# Patient Record
Sex: Male | Born: 1985 | Race: White | Hispanic: No | Marital: Single | Smoking: Never smoker
Health system: Southern US, Community
[De-identification: ages and names within clinical notes are randomized; demographics above are authoritative.]

## PROBLEM LIST (undated history)

## (undated) DIAGNOSIS — J45909 Unspecified asthma, uncomplicated: Secondary | ICD-10-CM

## (undated) HISTORY — DX: Unspecified asthma, uncomplicated: J45.909

---
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
## 2008-03-11 HISTORY — PX: LIPOSUCTION: SHX10

## 2013-03-29 ENCOUNTER — Encounter (INDEPENDENT_AMBULATORY_CARE_PROVIDER_SITE_OTHER): Payer: Self-pay

## 2013-03-29 ENCOUNTER — Encounter: Payer: Self-pay | Admitting: Diagnostic Neuroimaging

## 2013-03-29 ENCOUNTER — Ambulatory Visit (INDEPENDENT_AMBULATORY_CARE_PROVIDER_SITE_OTHER): Payer: BC Managed Care – PPO | Admitting: Diagnostic Neuroimaging

## 2013-03-29 VITALS — BP 125/87 | HR 66 | Temp 98.4°F | Ht 71.0 in | Wt 320.0 lb

## 2013-03-29 DIAGNOSIS — G510 Bell's palsy: Secondary | ICD-10-CM

## 2013-03-29 MED ORDER — AMITRIPTYLINE HCL 25 MG PO TABS: 25.0000 mg | ORAL_TABLET | Freq: Every day | ORAL | Status: DC

## 2013-03-29 NOTE — Progress Notes (Signed)
[facility]  PATIENT: Dustin Mccall. DOB: 1985-07-08  REFERRING CLINICIAN: Sandhu HISTORY FROM: patient  REASON FOR VISIT: new consult   HISTORICAL  CHIEF COMPLAINT:  Chief Complaint  Patient presents with  . Neurologic Problem    Bell's Palsy    HISTORY OF PRESENT ILLNESS:   28 year old male with asthma, here with left Bell's palsy.  03/17/13, patient noticed altered taste sensation. A glass of water tasted "metallic".  03/18/13, patient woke up and noticed the left corner of his mouth was slightly improving. His left eye was wide open and unable to shut. He also had some pain behind his left ear. He noticed some "bumps" which looked like complex left posterior auricular region and left posterior auricle. No change in hearing sensation or ringing in the ears.  03/19/13 patient went to urgent care and was diagnosed with Bell's palsy. He was treated with valacyclovir and prednisone course, which she just recently completed.  Since that time his taste sensation has slightly improved. He feels like he is somewhat improved control of his left eye. His left mouth is still almost completely paralyzed. He has noted some diffuse pressure and pain/headache sensations left occipital left parietal region, radiating to his left year and left face.  No prodromal infection, fever, chills, or vaccinations, international travel or exposure. He has no symptoms on his right face, arms or legs no vision changes.  Patient has been using artificial tears and left eye patch at nighttime for corneal protection.  REVIEW OF SYSTEMS: Full 14 system review of systems performed and notable only for headache blurred vision shortness of breath snoring.  ALLERGIES: No Known Allergies  HOME MEDICATIONS: No outpatient prescriptions prior to visit.   No facility-administered medications prior to visit.    PAST MEDICAL HISTORY: No past medical history on file.  PAST SURGICAL  HISTORY: No past surgical history on file.  FAMILY HISTORY: No family history on file.  SOCIAL HISTORY:  History   Social History  . Marital Status: Single    Spouse Name: N/A    Number of Children: 0  . Years of Education: MA   Occupational History  .  Other    unemployed   Social History Main Topics  . Smoking status: Never Smoker   . Smokeless tobacco: Current User    Types: Chew     Comment: 1 can every three days  . Alcohol Use: Yes     Comment: 3-4 beers socially  . Drug Use: No  . Sexual Activity: Not on file   Other Topics Concern  . Not on file   Social History Narrative   Patient lives at home with family.   Caffeine Use: 1 energy drink weekly     PHYSICAL EXAM  Filed Vitals:   03/29/13 0910  BP: 125/87  Pulse: 66  Temp: 98.4 F (36.9 C)  TempSrc: Oral  Height: 5\' 11"  (1.803 m)  Weight: 320 lb (145.151 kg)    Not recorded    Body mass index is 44.65 kg/(m^2).  GENERAL EXAM: Patient is in no distress; well developed, nourished and groomed; neck is supple  CARDIOVASCULAR: Regular rate and rhythm, no murmurs, no carotid bruits  NEUROLOGIC: MENTAL STATUS: awake, alert, oriented to person, place and time, recent and remote memory intact, normal attention and concentration, language fluent, comprehension intact, naming intact, fund of knowledge appropriate CRANIAL NERVE: no papilledema on fundoscopic exam, pupils equal and reactive to light, visual fields full to confrontation, extraocular  muscles intact, no nystagmus, SLIGHTLY DECR LEFT FOREHEAD SENS TO LT; DECR LEFT UPPER AND LOWER FACIAL STRENGHT. INCOMPLETE LEFT EYE CLOSURE. Hearing intact, palate elevates symmetrically, uvula midline, shoulder shrug symmetric, tongue midline. SMALL RED, LESION IN THE LEFT POSTERIOR AURICULAR REGION. NO VESICLES IN THE EXTERNAL AUDITORY CANALS OR TYMPANIC MEMBRANES. MOTOR: normal bulk and tone, full strength in the BUE, BLE SENSORY: normal and symmetric to  light touch, pinprick, temperature, vibration COORDINATION: finger-nose-finger, fine finger movements, heel-shin normal REFLEXES: deep tendon reflexes present and symmetric GAIT/STATION: narrow based gait; able to walk on toes, heels and tandem; romberg is negative    DIAGNOSTIC DATA (LABS, IMAGING, TESTING) - I reviewed patient records, labs, notes, testing and imaging myself where available.  No results found for this basename: WBC, HGB, HCT, MCV, PLT   No results found for this basename: na, k, cl, co2, glucose, bun, creatinine, calcium, prot, albumin, ast, alt, alkphos, bilitot, gfrnonaa, gfraa   No results found for this basename: CHOL, HDL, LDLCALC, LDLDIRECT, TRIG, CHOLHDL   No results found for this basename: HGBA1C   No results found for this basename: VITAMINB12   No results found for this basename: TSH      ASSESSMENT AND PLAN  28 y.o. year old male here with left peripheral cranial nerve 7 palsy starting on 03/17/13. Could represent Bell's palsy. 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 In any case, the patient received appropriate treatment with valtrex + prednisone.  PLAN: - amitriptyline for left side head pain and ear pain - continue eye precautions  Return in about 3 months (around 06/27/2013).    Suanne MarkerVIKRAM R. Blain Hunsucker, MD 03/29/2013, 10:33 AM Certified in Neurology, Neurophysiology and Neuroimaging  [facility] [address], [address] [place], [place] [address] [phone]

## 2013-06-28 ENCOUNTER — Encounter (INDEPENDENT_AMBULATORY_CARE_PROVIDER_SITE_OTHER): Payer: Self-pay

## 2013-06-28 ENCOUNTER — Encounter: Payer: Self-pay | Admitting: Diagnostic Neuroimaging

## 2013-06-28 ENCOUNTER — Ambulatory Visit (INDEPENDENT_AMBULATORY_CARE_PROVIDER_SITE_OTHER): Payer: BC Managed Care – PPO | Admitting: Diagnostic Neuroimaging

## 2013-06-28 VITALS — BP 117/78 | HR 93 | Ht 71.0 in | Wt 323.0 lb

## 2013-06-28 DIAGNOSIS — G510 Bell's palsy: Secondary | ICD-10-CM

## 2013-06-28 NOTE — Patient Instructions (Signed)
Continue eye safety precautions.

## 2013-06-28 NOTE — Progress Notes (Signed)
[facility]  PATIENT: Dustin SarMichael T Chausse Jr. DOB: [birth year]  REFERRING CLINICIAN: Sandhu HISTORY FROM: patient  REASON FOR VISIT: new consult   HISTORICAL  CHIEF COMPLAINT:  Chief Complaint  Patient presents with  . Follow-up    HISTORY OF PRESENT ILLNESS:   UPDATE 06/28/13: Since last visit, facial weakness symptoms are stable. Left ear pain is improved. Off amitriptyline now. Taste is improved. No eye pain.  PRIOR HPI (03/29/13): 28 year old male with asthma, here with left Bell's palsy.   03/17/13, patient noticed altered taste sensation. A glass of water tasted "metallic".  03/18/13, patient woke up and noticed the left corner of his mouth was slightly improving. His left eye was wide open and unable to shut. He also had some pain behind his left ear. He noticed some "bumps" which looked like complex left posterior auricular region and left posterior auricle. No change in hearing sensation or ringing in the ears.  03/19/13 patient went to urgent care and was diagnosed with Bell's palsy. He was treated with valacyclovir and prednisone course, which she just recently completed.  Since that time his taste sensation has slightly improved. He feels like he is somewhat improved control of his left eye. His left mouth is still almost completely paralyzed. He has noted some diffuse pressure and pain/headache sensations left occipital left parietal region, radiating to his left year and left face. No prodromal infection, fever, chills, or vaccinations, international travel or exposure. He has no symptoms on his right face, arms or legs no vision changes. Patient has been using artificial tears and left eye patch at nighttime for corneal protection.  REVIEW OF SYSTEMS: Full 14 system review of systems performed and notable only for speech diff and facial drooping.  ALLERGIES: No Known Allergies  HOME MEDICATIONS: Outpatient Prescriptions Prior to Visit  Medication Sig  Dispense Refill  . mometasone-formoterol (DULERA) 100-5 MCG/ACT AERO Inhale 2 puffs into the lungs 2 (two) times daily.      Marland Kitchen. amitriptyline (ELAVIL) 25 MG tablet Take 1 tablet (25 mg total) by mouth at bedtime.  30 tablet  3   No facility-administered medications prior to visit.    PAST MEDICAL HISTORY: Past Medical History  Diagnosis Date  . Asthma     PAST SURGICAL HISTORY: History reviewed. No pertinent past surgical history.  FAMILY HISTORY: History reviewed. No pertinent family history.  SOCIAL HISTORY:  History   Social History  . Marital Status: Single    Spouse Name: N/A    Number of Children: 0  . Years of Education: MA   Occupational History  .  Other    unemployed   Social History Main Topics  . Smoking status: Never Smoker   . Smokeless tobacco: Current User    Types: Chew     Comment: 1 can every three days  . Alcohol Use: Yes     Comment: 3-4 beers socially  . Drug Use: No  . Sexual Activity: Not on file   Other Topics Concern  . Not on file   Social History Narrative   Patient lives at home with family.   Caffeine Use: 1 energy drink weekly     PHYSICAL EXAM  Filed Vitals:   06/28/13 1406  BP: 117/78  Pulse: 93  Height: 5\' 11"  (1.803 m)  Weight: 323 lb (146.512 kg)    Not recorded    Body mass index is 45.07 kg/(m^2).  GENERAL EXAM: Patient is in no distress; well developed, nourished and  groomed; neck is supple  CARDIOVASCULAR: Regular rate and rhythm, no murmurs, no carotid bruits  NEUROLOGIC: MENTAL STATUS: awake, alert, oriented to person, place and time, recent and remote memory intact, normal attention and concentration, language fluent, comprehension intact, naming intact, fund of knowledge appropriate CRANIAL NERVE: no papilledema on fundoscopic exam, pupils equal and reactive to light, visual fields full to confrontation, extraocular muscles intact, no nystagmus, SLIGHTLY DECR LEFT FOREHEAD SENS TO LT; DECR LEFT UPPER  AND LOWER FACIAL STRENGHT. INCOMPLETE LEFT EYE CLOSURE. Hearing intact, palate elevates symmetrically, uvula midline, shoulder shrug symmetric, tongue midline.  MOTOR: normal bulk and tone, full strength in the BUE, BLE SENSORY: normal and symmetric to light touch, pinprick, temperature, vibration COORDINATION: finger-nose-finger, fine finger movements, heel-shin normal REFLEXES: deep tendon reflexes present and symmetric GAIT/STATION: narrow based gait; able to walk tandem; romberg is negative    DIAGNOSTIC DATA (LABS, IMAGING, TESTING) - I reviewed patient records, labs, notes, testing and imaging myself where available.  No results found for this basename: WBC,  HGB,  HCT,  MCV,  PLT   No results found for this basename: na,  k,  cl,  co2,  glucose,  bun,  creatinine,  calcium,  prot,  albumin,  ast,  alt,  alkphos,  bilitot,  gfrnonaa,  gfraa   No results found for this basename: CHOL,  HDL,  LDLCALC,  LDLDIRECT,  TRIG,  CHOLHDL   No results found for this basename: HGBA1C   No results found for this basename: VITAMINB12   No results found for this basename: TSH     ASSESSMENT AND PLAN  [age] y.o. year old male here with left peripheral cranial nerve 7 palsy starting on 03/17/13. Could represent Bell's palsy. 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 In any case, the patient received appropriate treatment with valtrex + prednisone.  PLAN: - continue eye precautions - f/u as needed  Return if symptoms worsen or fail to improve.    Suanne MarkerVIKRAM R. Babette Stum, MD 06/28/2013, 2:46 PM Certified in Neurology, Neurophysiology and Neuroimaging  [facility] [address], [address] [place], [place] [address] [phone]

## 2013-11-04 ENCOUNTER — Ambulatory Visit (INDEPENDENT_AMBULATORY_CARE_PROVIDER_SITE_OTHER): Payer: BC Managed Care – PPO | Admitting: Surgery

## 2013-11-05 ENCOUNTER — Ambulatory Visit (INDEPENDENT_AMBULATORY_CARE_PROVIDER_SITE_OTHER): Payer: BC Managed Care – PPO | Admitting: General Surgery

## 2013-11-11 ENCOUNTER — Ambulatory Visit (INDEPENDENT_AMBULATORY_CARE_PROVIDER_SITE_OTHER): Payer: BC Managed Care – PPO | Admitting: General Surgery

## 2013-11-11 ENCOUNTER — Other Ambulatory Visit (INDEPENDENT_AMBULATORY_CARE_PROVIDER_SITE_OTHER): Payer: Self-pay | Admitting: General Surgery

## 2013-11-11 NOTE — Addendum Note (Signed)
Addended byIgnacia Marvel on: 11/11/2013 03:24 PM   Modules accepted: Orders

## 2013-11-20 ENCOUNTER — Encounter: Payer: BC Managed Care – PPO | Attending: General Surgery | Admitting: Dietician

## 2013-11-20 ENCOUNTER — Encounter: Payer: Self-pay | Admitting: Dietician

## 2013-11-20 DIAGNOSIS — Z01818 Encounter for other preprocedural examination: Secondary | ICD-10-CM | POA: Diagnosis not present

## 2013-11-20 DIAGNOSIS — Z713 Dietary counseling and surveillance: Secondary | ICD-10-CM | POA: Diagnosis not present

## 2013-11-20 DIAGNOSIS — Z6841 Body Mass Index (BMI) 40.0 and over, adult: Secondary | ICD-10-CM | POA: Diagnosis not present

## 2013-11-20 NOTE — Progress Notes (Signed)
  Pre-Op Assessment Visit:  Pre-Operative LAGB Surgery  Medical Nutrition Therapy:  Appt start time: 1100   End time:  1130.  Patient was seen on 11/20/2013 for Pre-Operative LAGB Nutrition Assessment. Assessment and letter of approval faxed to [facility] Surgery Bariatric Surgery Program coordinator on 11/20/2013.   Preferred Learning Style:   No preference indicated   Learning Readiness:   Ready  Handouts given during visit include:  Pre-Op Goals Bariatric Surgery Protein Shakes Pre op diet handout per patient request  Teaching Method Utilized:  Visual Auditory Hands on  Barriers to learning/adherence to lifestyle change: none  Demonstrated degree of understanding via:  Teach Back   Patient to call the [facility] to enroll in Pre-Op and Post-Op Nutrition Education when surgery date is scheduled.

## 2013-11-25 LAB — HEMOGLOBIN A1C
Hgb A1c MFr Bld: 5.7 % — ABNORMAL HIGH (ref <5.7)
MEAN PLASMA GLUCOSE: 117 mg/dL — AB (ref <117)

## 2013-11-25 LAB — TSH: TSH: 3.739 u[IU]/mL (ref 0.350–4.500)

## 2013-11-25 LAB — H. PYLORI ANTIBODY, IGG: H Pylori IgG: 0.65 {ISR}

## 2013-12-01 ENCOUNTER — Ambulatory Visit (HOSPITAL_COMMUNITY)
Admission: RE | Admit: 2013-12-01 | Discharge: 2013-12-01 | Disposition: A | Discharge status: Home / Self Care | Payer: BC Managed Care – PPO | Source: Ambulatory Visit | Attending: General Surgery | Admitting: General Surgery

## 2013-12-01 DIAGNOSIS — K7689 Other specified diseases of liver: Secondary | ICD-10-CM | POA: Diagnosis not present

## 2013-12-01 DIAGNOSIS — Z01818 Encounter for other preprocedural examination: Secondary | ICD-10-CM | POA: Diagnosis not present

## 2013-12-01 DIAGNOSIS — Z6841 Body Mass Index (BMI) 40.0 and over, adult: Secondary | ICD-10-CM | POA: Insufficient documentation

## 2013-12-31 ENCOUNTER — Other Ambulatory Visit (INDEPENDENT_AMBULATORY_CARE_PROVIDER_SITE_OTHER): Payer: Self-pay | Admitting: General Surgery

## 2014-01-17 ENCOUNTER — Encounter: Payer: BC Managed Care – PPO | Attending: General Surgery

## 2014-01-17 VITALS — Ht 71.0 in | Wt 316.0 lb

## 2014-01-17 DIAGNOSIS — Z6841 Body Mass Index (BMI) 40.0 and over, adult: Secondary | ICD-10-CM | POA: Insufficient documentation

## 2014-01-17 DIAGNOSIS — E669 Obesity, unspecified: Secondary | ICD-10-CM | POA: Diagnosis present

## 2014-01-17 DIAGNOSIS — Z713 Dietary counseling and surveillance: Secondary | ICD-10-CM | POA: Diagnosis not present

## 2014-01-19 NOTE — Progress Notes (Signed)
  Pre-Operative Nutrition Class:  Appt start time: 2072   End time:  1830.  Patient was seen on 01/17/2014 for Pre-Operative Bariatric Surgery Education at the [facility].   Surgery date: 02/07/14 Surgery type: LAGB Start weight at [facility]: 319 lbs on 11/20/13 Weight today: 316 lbs  TANITA  BODY COMP RESULTS  01/17/14   BMI (kg/m^2) 44.1   Fat Mass (lbs) 181   Fat Free Mass (lbs) 135   Total Body Water (lbs) 99   Samples given per MNT protocol. Patient educated on appropriate usage: Premier protein shake (vanilla - qty 1) Lot #: [id number] Exp: 07/2014  Unjury protein powder (chicken soup - qty 1) Lot #: [id number] Exp: 02/2014  Celebrate Vitamins Multivitamin (pineapple strawberry - qty 1) Lot #: [id number] Exp: 04/2014  PB2 (qty 1) Lot #: [id number] Exp: 10/2014  The following the learning objectives were met by the patient during this course:  Identify Pre-Op Dietary Goals and will begin 2 weeks pre-operatively  Identify appropriate sources of fluids and proteins   State protein recommendations and appropriate sources pre and post-operatively  Identify Post-Operative Dietary Goals and will follow for 2 weeks post-operatively  Identify appropriate multivitamin and calcium sources  Describe the need for physical activity post-operatively and will follow MD recommendations  State when to call healthcare provider regarding medication questions or post-operative complications  Handouts given during class include:  Pre-Op Bariatric Surgery Diet Handout  Protein Shake Handout  Post-Op Bariatric Surgery Nutrition Handout  BELT Program Information Flyer  Support Group Information Flyer  [facility] Bariatric Supplements Price List  Follow-Up Plan: Patient will follow-up at [facility] 2 weeks post operatively for diet advancement per MD.

## 2014-01-27 NOTE — Patient Instructions (Addendum)
Dustin SarMichael T Everitt Jr.  01/27/2014                           YOUR PROCEDURE IS SCHEDULED ON:  02/07/14                ENTER FROM [address] - GO TO PARKING DECK               LOOK FOR VALET PARKING  / [employer]                              FOLLOW  SIGNS TO SHORT STAY CENTER                 ARRIVE AT SHORT STAY AT:  8:15 AM               CALL THIS NUMBER IF ANY PROBLEMS THE DAY OF SURGERY :               [phone]                                REMEMBER:   Do not eat food or drink liquids AFTER MIDNIGHT              STOP ASPIRIN AND HERBAL MEDS 7 DAYS PREOP                  Take these medicines the morning of surgery with               A SIPS OF WATER :   NONE      Do not wear jewelry, make-up   Do not wear lotions, powders, or perfumes.   Do not shave legs or underarms 12 hrs. before surgery (men may shave face)  Do not bring valuables to the hospital.  Contacts, dentures or bridgework may not be worn into surgery.  Leave suitcase in the car. After surgery it may be brought to your room.  For patients admitted to the hospital more than one night, checkout time is            11:00 AM                                                      ________________________________________________________________________                                                                                                  [place] - PREPARING FOR SURGERY  Before surgery, you can play an important role.  Because skin is not sterile, your skin needs to be as free of germs as possible.  You can reduce the number of germs on your skin by washing with CHG (chlorahexidine gluconate) soap before surgery.  CHG is an antiseptic  cleaner which kills germs and bonds with the skin to continue killing germs even after washing. Please DO NOT use if you have an allergy to CHG or antibacterial soaps.  If your skin becomes reddened/irritated stop using the CHG and inform your nurse when you  arrive at Short Stay. Do not shave (including legs and underarms) for at least 48 hours prior to the first CHG shower.  You may shave your face. Please follow these instructions carefully:   1.  Shower with CHG Soap the night before surgery and the  morning of Surgery.   2.  If you choose to wash your hair, wash your hair first as usual with your  normal  Shampoo.   3.  After you shampoo, rinse your hair and body thoroughly to remove the  shampoo.                                         4.  Use CHG as you would any other liquid soap.  You can apply chg directly  to the skin and wash . Gently wash with scrungie or clean wascloth    5.  Apply the CHG Soap to your body ONLY FROM THE NECK DOWN.   Do not use on open                           Wound or open sores. Avoid contact with eyes, ears mouth and genitals (private parts).                        Genitals (private parts) with your normal soap.              6.  Wash thoroughly, paying special attention to the area where your surgery  will be performed.   7.  Thoroughly rinse your body with warm water from the neck down.   8.  DO NOT shower/wash with your normal soap after using and rinsing off  the CHG Soap .                9.  Pat yourself dry with a clean towel.             10.  Wear clean pajamas.             11.  Place clean sheets on your bed the night of your first shower and do not  sleep with pets.  Day of Surgery : Do not apply any lotions/deodorants the morning of surgery.  Please wear clean clothes to the hospital/surgery center.  FAILURE TO FOLLOW THESE INSTRUCTIONS MAY RESULT IN THE CANCELLATION OF YOUR SURGERY    PATIENT SIGNATURE_________________________________  ______________________________________________________________________

## 2014-01-28 ENCOUNTER — Encounter (HOSPITAL_COMMUNITY): Payer: Self-pay

## 2014-01-28 ENCOUNTER — Encounter (HOSPITAL_COMMUNITY)
Admission: RE | Admit: 2014-01-28 | Discharge: 2014-01-28 | Disposition: A | Discharge status: Home / Self Care | Payer: BC Managed Care – PPO | Source: Ambulatory Visit | Attending: General Surgery | Admitting: General Surgery

## 2014-01-28 DIAGNOSIS — Z01812 Encounter for preprocedural laboratory examination: Secondary | ICD-10-CM | POA: Diagnosis present

## 2014-01-28 LAB — COMPREHENSIVE METABOLIC PANEL
ALT: 88 U/L — AB (ref 0–53)
AST: 63 U/L — ABNORMAL HIGH (ref 0–37)
Albumin: 4.1 g/dL (ref 3.5–5.2)
Alkaline Phosphatase: 101 U/L (ref 39–117)
Anion gap: 13 (ref 5–15)
BUN: 14 mg/dL (ref 6–23)
CALCIUM: 9.9 mg/dL (ref 8.4–10.5)
CO2: 24 meq/L (ref 19–32)
Chloride: 99 mEq/L (ref 96–112)
Creatinine, Ser: 0.86 mg/dL (ref 0.50–1.35)
GFR calc Af Amer: >90 mL/min (ref >90)
GLUCOSE: 92 mg/dL (ref 70–99)
Potassium: 4.2 mEq/L (ref 3.7–5.3)
SODIUM: 136 meq/L — AB (ref 137–147)
Total Bilirubin: 0.3 mg/dL (ref 0.3–1.2)
Total Protein: 7.6 g/dL (ref 6.0–8.3)

## 2014-01-28 LAB — CBC WITH DIFFERENTIAL/PLATELET
Basophils Absolute: 0 10*3/uL (ref 0.0–0.1)
Basophils Relative: 0 % (ref 0–1)
EOS PCT: 3 % (ref 0–5)
Eosinophils Absolute: 0.4 10*3/uL (ref 0.0–0.7)
HEMATOCRIT: 46.2 % (ref 39.0–52.0)
HEMOGLOBIN: 15.9 g/dL (ref 13.0–17.0)
LYMPHS ABS: 3.2 10*3/uL (ref 0.7–4.0)
LYMPHS PCT: 29 % (ref 12–46)
MCH: 29.2 pg (ref 26.0–34.0)
MCHC: 34.4 g/dL (ref 30.0–36.0)
MCV: 84.8 fL (ref 78.0–100.0)
MONOS PCT: 8 % (ref 3–12)
Monocytes Absolute: 0.9 10*3/uL (ref 0.1–1.0)
Neutro Abs: 6.6 10*3/uL (ref 1.7–7.7)
Neutrophils Relative %: 60 % (ref 43–77)
Platelets: 210 10*3/uL (ref 150–400)
RBC: 5.45 MIL/uL (ref 4.22–5.81)
RDW: 13.1 % (ref 11.5–15.5)
WBC: 11 10*3/uL — AB (ref 4.0–10.5)

## 2014-02-07 ENCOUNTER — Inpatient Hospital Stay (HOSPITAL_COMMUNITY): Payer: BC Managed Care – PPO | Admitting: Registered Nurse

## 2014-02-07 ENCOUNTER — Inpatient Hospital Stay (HOSPITAL_COMMUNITY): Payer: BC Managed Care – PPO

## 2014-02-07 ENCOUNTER — Encounter (HOSPITAL_COMMUNITY): Payer: Self-pay | Admitting: *Deleted

## 2014-02-07 ENCOUNTER — Ambulatory Visit (HOSPITAL_COMMUNITY)
Admission: RE | Admit: 2014-02-07 | Discharge: 2014-02-07 | Disposition: A | Discharge status: Home / Self Care | Payer: BC Managed Care – PPO | Source: Ambulatory Visit | Attending: General Surgery | Admitting: General Surgery

## 2014-02-07 ENCOUNTER — Encounter (HOSPITAL_COMMUNITY)
Admission: RE | Disposition: A | Discharge status: Home / Self Care | Payer: Self-pay | Source: Ambulatory Visit | Attending: General Surgery

## 2014-02-07 DIAGNOSIS — J45909 Unspecified asthma, uncomplicated: Secondary | ICD-10-CM | POA: Insufficient documentation

## 2014-02-07 DIAGNOSIS — Z87891 Personal history of nicotine dependence: Secondary | ICD-10-CM | POA: Diagnosis not present

## 2014-02-07 DIAGNOSIS — R740 Nonspecific elevation of levels of transaminase and lactic acid dehydrogenase [LDH]: Secondary | ICD-10-CM | POA: Diagnosis not present

## 2014-02-07 DIAGNOSIS — R7309 Other abnormal glucose: Secondary | ICD-10-CM | POA: Insufficient documentation

## 2014-02-07 DIAGNOSIS — R140 Abdominal distension (gaseous): Secondary | ICD-10-CM

## 2014-02-07 DIAGNOSIS — Z6841 Body Mass Index (BMI) 40.0 and over, adult: Secondary | ICD-10-CM | POA: Insufficient documentation

## 2014-02-07 DIAGNOSIS — K760 Fatty (change of) liver, not elsewhere classified: Secondary | ICD-10-CM | POA: Diagnosis not present

## 2014-02-07 HISTORY — PX: LAPAROSCOPIC GASTRIC BANDING: SHX1100

## 2014-02-07 SURGERY — GASTRIC BANDING, LAPAROSCOPIC
Anesthesia: General

## 2014-02-07 MED ORDER — ONDANSETRON HCL 4 MG/2ML IJ SOLN
INTRAMUSCULAR | Status: AC
  Filled 2014-02-07: qty 2

## 2014-02-07 MED ORDER — PROPOFOL 10 MG/ML IV BOLUS
INTRAVENOUS | Status: AC
  Filled 2014-02-07: qty 20

## 2014-02-07 MED ORDER — UNJURY CHICKEN SOUP POWDER
2.0000 [oz_av] | Freq: Four times a day (QID) | ORAL | Status: DC
  Filled 2014-02-07 (×4): qty 27

## 2014-02-07 MED ORDER — SODIUM CHLORIDE 0.9 % IJ SOLN
INTRAMUSCULAR | Status: AC
  Filled 2014-02-07: qty 10

## 2014-02-07 MED ORDER — ACETAMINOPHEN 160 MG/5ML PO SOLN
650.0000 mg | ORAL | Status: DC | PRN
  Filled 2014-02-07: qty 20.3

## 2014-02-07 MED ORDER — ROCURONIUM BROMIDE 100 MG/10ML IV SOLN
INTRAVENOUS | Status: AC
  Filled 2014-02-07: qty 1

## 2014-02-07 MED ORDER — PROPOFOL 10 MG/ML IV BOLUS
INTRAVENOUS | Status: DC | PRN
  Administered 2014-02-07: 220 mg via INTRAVENOUS

## 2014-02-07 MED ORDER — ACETAMINOPHEN 160 MG/5ML PO SOLN
325.0000 mg | ORAL | Status: DC | PRN
  Filled 2014-02-07: qty 20.3

## 2014-02-07 MED ORDER — HYDROMORPHONE HCL 1 MG/ML IJ SOLN: 0.2500 mg | INTRAMUSCULAR | Status: DC | PRN

## 2014-02-07 MED ORDER — BUPIVACAINE-EPINEPHRINE 0.25% -1:200000 IJ SOLN
INTRAMUSCULAR | Status: DC | PRN
  Administered 2014-02-07: 30 mL

## 2014-02-07 MED ORDER — LIDOCAINE HCL (CARDIAC) 20 MG/ML IV SOLN
INTRAVENOUS | Status: DC | PRN
  Administered 2014-02-07: 100 mg via INTRAVENOUS

## 2014-02-07 MED ORDER — LACTATED RINGERS IV SOLN: INTRAVENOUS | Status: DC

## 2014-02-07 MED ORDER — OXYCODONE HCL 5 MG/5ML PO SOLN
5.0000 mg | ORAL | Status: DC | PRN
  Administered 2014-02-07 (×2): 5 mg via ORAL
  Filled 2014-02-07 (×3): qty 10

## 2014-02-07 MED ORDER — NEOSTIGMINE METHYLSULFATE 10 MG/10ML IV SOLN
INTRAVENOUS | Status: DC | PRN
  Administered 2014-02-07: 5 mg via INTRAVENOUS

## 2014-02-07 MED ORDER — DEXTROSE 5 % IV SOLN
2.0000 g | INTRAVENOUS | Status: AC
  Administered 2014-02-07: 2 g via INTRAVENOUS

## 2014-02-07 MED ORDER — HEPARIN SODIUM (PORCINE) 5000 UNIT/ML IJ SOLN
5000.0000 [IU] | INTRAMUSCULAR | Status: AC
  Administered 2014-02-07: 5000 [IU] via SUBCUTANEOUS
  Filled 2014-02-07: qty 1

## 2014-02-07 MED ORDER — UNJURY CHOCOLATE CLASSIC POWDER
2.0000 [oz_av] | Freq: Four times a day (QID) | ORAL | Status: DC
  Filled 2014-02-07 (×4): qty 27

## 2014-02-07 MED ORDER — MIDAZOLAM HCL 2 MG/2ML IJ SOLN
INTRAMUSCULAR | Status: AC
  Filled 2014-02-07: qty 2

## 2014-02-07 MED ORDER — MORPHINE SULFATE 10 MG/ML IJ SOLN: 2.0000 mg | INTRAMUSCULAR | Status: DC | PRN

## 2014-02-07 MED ORDER — LACTATED RINGERS IV SOLN
INTRAVENOUS | Status: DC
  Administered 2014-02-07: 1000 mL via INTRAVENOUS

## 2014-02-07 MED ORDER — CHLORHEXIDINE GLUCONATE 4 % EX LIQD: 60.0000 mL | Freq: Once | CUTANEOUS | Status: DC

## 2014-02-07 MED ORDER — SODIUM CHLORIDE 0.9 % IJ SOLN
INTRAMUSCULAR | Status: DC | PRN
  Administered 2014-02-07: 20 mL via INTRAVENOUS

## 2014-02-07 MED ORDER — KETOROLAC TROMETHAMINE 30 MG/ML IJ SOLN
INTRAMUSCULAR | Status: DC | PRN
  Administered 2014-02-07: 30 mg via INTRAVENOUS

## 2014-02-07 MED ORDER — BUPIVACAINE-EPINEPHRINE (PF) 0.25% -1:200000 IJ SOLN
INTRAMUSCULAR | Status: AC
  Filled 2014-02-07: qty 30

## 2014-02-07 MED ORDER — GLYCOPYRROLATE 0.2 MG/ML IJ SOLN
INTRAMUSCULAR | Status: AC
  Filled 2014-02-07: qty 3

## 2014-02-07 MED ORDER — GLYCOPYRROLATE 0.2 MG/ML IJ SOLN
INTRAMUSCULAR | Status: DC | PRN
  Administered 2014-02-07: .8 mg via INTRAVENOUS

## 2014-02-07 MED ORDER — ONDANSETRON HCL 4 MG/2ML IJ SOLN: 4.0000 mg | INTRAMUSCULAR | Status: DC | PRN

## 2014-02-07 MED ORDER — ROCURONIUM BROMIDE 100 MG/10ML IV SOLN
INTRAVENOUS | Status: DC | PRN
  Administered 2014-02-07: 30 mg via INTRAVENOUS
  Administered 2014-02-07: 10 mg via INTRAVENOUS

## 2014-02-07 MED ORDER — KETOROLAC TROMETHAMINE 30 MG/ML IJ SOLN
INTRAMUSCULAR | Status: AC
  Filled 2014-02-07: qty 1

## 2014-02-07 MED ORDER — LIDOCAINE HCL (CARDIAC) 20 MG/ML IV SOLN
INTRAVENOUS | Status: AC
  Filled 2014-02-07: qty 5

## 2014-02-07 MED ORDER — DEXAMETHASONE SODIUM PHOSPHATE 10 MG/ML IJ SOLN
INTRAMUSCULAR | Status: AC
  Filled 2014-02-07: qty 1

## 2014-02-07 MED ORDER — UNJURY VANILLA POWDER
2.0000 [oz_av] | Freq: Four times a day (QID) | ORAL | Status: DC
  Filled 2014-02-07 (×4): qty 27

## 2014-02-07 MED ORDER — MIDAZOLAM HCL 5 MG/5ML IJ SOLN
INTRAMUSCULAR | Status: DC | PRN
  Administered 2014-02-07: 2 mg via INTRAVENOUS

## 2014-02-07 MED ORDER — GLYCOPYRROLATE 0.2 MG/ML IJ SOLN
INTRAMUSCULAR | Status: AC
  Filled 2014-02-07: qty 1

## 2014-02-07 MED ORDER — SUCCINYLCHOLINE CHLORIDE 20 MG/ML IJ SOLN
INTRAMUSCULAR | Status: DC | PRN
  Administered 2014-02-07: 120 mg via INTRAVENOUS

## 2014-02-07 MED ORDER — POTASSIUM CHLORIDE IN NACL 20-0.9 MEQ/L-% IV SOLN: INTRAVENOUS | Status: DC

## 2014-02-07 MED ORDER — OXYCODONE HCL 5 MG/5ML PO SOLN: 5.0000 mg | ORAL | Status: AC | PRN

## 2014-02-07 MED ORDER — FENTANYL CITRATE 0.05 MG/ML IJ SOLN
INTRAMUSCULAR | Status: AC
  Filled 2014-02-07: qty 5

## 2014-02-07 MED ORDER — CEFOXITIN SODIUM 2 G IV SOLR
INTRAVENOUS | Status: AC
  Filled 2014-02-07: qty 2

## 2014-02-07 MED ORDER — FENTANYL CITRATE 0.05 MG/ML IJ SOLN
INTRAMUSCULAR | Status: DC | PRN
  Administered 2014-02-07 (×5): 50 ug via INTRAVENOUS

## 2014-02-07 MED ORDER — DEXAMETHASONE SODIUM PHOSPHATE 10 MG/ML IJ SOLN
INTRAMUSCULAR | Status: DC | PRN
  Administered 2014-02-07: 10 mg via INTRAVENOUS

## 2014-02-07 SURGICAL SUPPLY — 53 items
APL SKNCLS STERI-STRIP NONHPOA (GAUZE/BANDAGES/DRESSINGS)
BAND LAP 10.0 W/TUBES (Band) ×1 IMPLANT
BENZOIN TINCTURE PRP APPL 2/3 (GAUZE/BANDAGES/DRESSINGS) IMPLANT
BLADE HEX COATED 2.75 (ELECTRODE) ×2 IMPLANT
BLADE SURG 15 STRL LF DISP TIS (BLADE) ×1 IMPLANT
BLADE SURG 15 STRL SS (BLADE) ×2
BLADE SURG SZ11 CARB STEEL (BLADE) ×2 IMPLANT
CANISTER SUCT 3000ML (MISCELLANEOUS) ×1 IMPLANT
CHLORAPREP W/TINT 26ML (MISCELLANEOUS) ×3 IMPLANT
DECANTER SPIKE VIAL GLASS SM (MISCELLANEOUS) ×2 IMPLANT
DEVICE SUT QUICK LOAD TK 5 (STAPLE) ×6 IMPLANT
DEVICE SUT TI-KNOT TK 5X26 (MISCELLANEOUS) ×2 IMPLANT
DEVICE SUTURE ENDOST 10MM (ENDOMECHANICALS) IMPLANT
DISSECTOR BLUNT TIP ENDO 5MM (MISCELLANEOUS) IMPLANT
DRAPE CAMERA CLOSED 9X96 (DRAPES) ×2 IMPLANT
DRAPE UTILITY XL STRL (DRAPES) ×4 IMPLANT
ELECT REM PT RETURN 9FT ADLT (ELECTROSURGICAL) ×2
ELECTRODE REM PT RTRN 9FT ADLT (ELECTROSURGICAL) ×1 IMPLANT
GLOVE BIO SURGEON STRL SZ7.5 (GLOVE) ×2 IMPLANT
GLOVE BIOGEL M STRL SZ7.5 (GLOVE) IMPLANT
GLOVE INDICATOR 8.0 STRL GRN (GLOVE) ×2 IMPLANT
GOWN STRL REUS W/TWL XL LVL3 (GOWN DISPOSABLE) ×8 IMPLANT
HOVERMATT SINGLE USE (MISCELLANEOUS) ×2 IMPLANT
KIT BASIN OR (CUSTOM PROCEDURE TRAY) ×2 IMPLANT
LIQUID BAND (GAUZE/BANDAGES/DRESSINGS) ×1 IMPLANT
MESH HERNIA 1X4 RECT BARD (Mesh General) IMPLANT
MESH HERNIA BARD 1X4 (Mesh General) ×1 IMPLANT
NDL SPNL 22GX3.5 QUINCKE BK (NEEDLE) ×1 IMPLANT
NEEDLE SPNL 22GX3.5 QUINCKE BK (NEEDLE) ×2 IMPLANT
NS IRRIG 1000ML POUR BTL (IV SOLUTION) ×2 IMPLANT
PACK UNIVERSAL I (CUSTOM PROCEDURE TRAY) ×2 IMPLANT
PENCIL BUTTON HOLSTER BLD 10FT (ELECTRODE) ×2 IMPLANT
SET IRRIG TUBING LAPAROSCOPIC (IRRIGATION / IRRIGATOR) IMPLANT
SHEARS HARMONIC ACE PLUS 36CM (ENDOMECHANICALS) IMPLANT
SLEEVE XCEL OPT CAN 5 100 (ENDOMECHANICALS) ×4 IMPLANT
SOLUTION ANTI FOG 6CC (MISCELLANEOUS) ×2 IMPLANT
SPONGE LAP 18X18 X RAY DECT (DISPOSABLE) ×2 IMPLANT
STAPLER VISISTAT 35W (STAPLE) IMPLANT
SUT ETHIBOND 2 0 SH (SUTURE) ×6
SUT ETHIBOND 2 0 SH 36X2 (SUTURE) ×3 IMPLANT
SUT MNCRL AB 4-0 PS2 18 (SUTURE) ×2 IMPLANT
SUT PROLENE 2 0 CT2 30 (SUTURE) ×2 IMPLANT
SUT SILK 0 (SUTURE) ×2
SUT SILK 0 30XBRD TIE 6 (SUTURE) ×1 IMPLANT
SUT SURGIDAC NAB ES-9 0 48 120 (SUTURE) IMPLANT
SUT VIC AB 2-0 SH 27 (SUTURE) ×2
SUT VIC AB 2-0 SH 27X BRD (SUTURE) ×1 IMPLANT
SYR 20CC LL (SYRINGE) ×4 IMPLANT
TOWEL OR 17X26 10 PK STRL BLUE (TOWEL DISPOSABLE) ×2 IMPLANT
TROCAR BLADELESS 15MM (ENDOMECHANICALS) ×2 IMPLANT
TROCAR BLADELESS OPT 5 100 (ENDOMECHANICALS) ×2 IMPLANT
TUBE CALIBRATION LAPBAND (TUBING) ×2 IMPLANT
TUBING INSUFFLATION 10FT LAP (TUBING) ×2 IMPLANT

## 2014-02-07 NOTE — Op Note (Signed)
Dustin SarMichael T Tamblyn Jr. [id number] Jan 05, 1986 02/07/2014  Laparoscopic Adjustable Gastric Band Placement Operative Note   Pre-operative Diagnosis: Morbid Obesity (BMI 42) Prediabetes Fatty liver Elevated Transaminases Asthma  Post-operative Diagnosis: Same  Surgeon: Atilano InaWILSON,Nichlos Kunzler M MD FACS FASMBS  Assistants: Luretha MurphyMatthew Martin, MD FACS  Anesthesia: General endotracheal anesthesia  ASA Class: 3   Anesthesia: General plus local  Indications: Morbid Obesity unresponsive to medical treatment.  Findings: AP-Standard   Procedure Details  The patient was seen in the Holding Room. The risks, benefits, complications, treatment options, and expected outcomes were discussed with the patient. The possibilities of reaction to medication, pulmonary aspiration, perforation of viscus, bleeding, recurrent infection, the need for additional procedures, failure to diagnose a condition, and creating a complication requiring transfusion or operation were discussed with the patient. The patient concurred with the proposed plan, giving informed consent.   The [facility], identified as Dustin SarMichael T Lacorte Jr. and the procedure verified as Laparoscopic Adjustable Gastric Band Placement. A Time Out was held and the above information confirmed.  Full general anesthesia was induced with orotracheal intubation.  The patient was prepped and draped in a supine position. Appropriate antibiotics were given intravenously.  A 1cm incision was made 2 fingerbreadths below the left subcostal margin . Opitview technique was used to gain entry to the abdominal cavity. A 5 mm blunt trocar was advanced under direct vision through the abdominal wall with a 0 degree scope. The abdomen was insufflated, the laparoscope introduced.  There were no untoward findings on diagnostic laparoscopy. Trocars were placed under direct vision in the following fashion: a 5mm trocar in the lateral  right upper quadrant, a 15mm trocar in the right upper quadrant, a 5mm trocar in the high epigastrium, and one 5mm trocar slightly above and to the left of the umbilicus. The patient was placed in reverse trendelenburg position.  The [facility] liver retractor was then placed through the high epigastric trocar site and positioned to hold the liver.   A calibration tube was passed down the oropharynx and into the stomach. 10cc of air was inflated into the balloon and then the calibration tube was gently pulled back toward the GE junction. There was resistance at the GE junciton/hiatus. There was no dimple either. Therefore I felt there was no evidence of a hiatal hernia. His preoperative UGI also showed no evidence of hiatal hernia. The angle of His was identified and the left crus was dissected free.  Approximately 8cm below the angle of His on the lesser curvature, passing through the pars flaccida and preserving the vagus nerve, a blunt instrument was gently passed anterior to the right crus and behind the gastro-esophageal junction without difficulty. Care was taken to minimize posterior dissection in order to prevent a posterior slip.   A AP-Standard Lap Band was introduced into the abdominal cavity through the 15mm trocar and carried around the gastro-esophageal junction and locked onto itself. Three interrupted 2.0 Ethibond sutures (each secured with a titanium tie knot) were used to imbricate the anterior stomach to itself over the band to prevent anterior slippage.   The bowel was examined and there were no obvious lesions. Hemostasis was verified. The liver retractor was removed under direct visualization. There was no evidence of liver injury. The tubing from the band was brought out via the right upper quadrant 11mm trocar site. All trocars were then removed under direct vision. The skin incision was lengthened and a subcutaneous space  was made to accommodate the port. A 1 inch square of vicryl mesh  was anchored to the base of the port with 4 sutures. The port was attached to the tubing and then placed in the subcutaneous pocket.  The redundant tubing was advanced back into the abdominal cavity. Inverted interrupted deep dermal sutures using a 2-0 vicryl were placed.   The wounds were heavily irrigated. The skin incisions were closed with 4-0 monocryl. Liquidband exceed was applied.   Instrument, sponge, and needle counts were correct prior to wound closure and at the conclusion of the case.          Complications:  None; patient tolerated the procedure well.                Condition: stable  Mary SellaEric M. Andrey CampanileWilson, MD, FACS General, Bariatric, & Minimally Invasive Surgery [facility] [place]

## 2014-02-07 NOTE — Transfer of Care (Signed)
Immediate Anesthesia Transfer of Care Note  Patient: Dustin SarMichael T Mcphail Jr.  Procedure(s) Performed: Procedure(s): LAPAROSCOPIC GASTRIC BANDING (N/A)  Patient Location: PACU  Anesthesia Type:General  Level of Consciousness: awake, alert , oriented and patient cooperative  Airway & Oxygen Therapy: Patient Spontanous Breathing and Patient connected to face mask oxygen  Post-op Assessment: Report given to PACU RN, Post -op Vital signs reviewed and stable and Patient moving all extremities  Post vital signs: Reviewed and stable  Complications: No apparent anesthesia complications

## 2014-02-07 NOTE — H&P (Addendum)
[place] 01/27/2014 2:23 PM Location: [facility] Patient #: [id number] DOB: Aug 30, 1985 Single / Language: Cleophus Molt / Race: White Male  History of Present Illness Randall Hiss M. Jacen Carlini MD; 01/27/2014 3:00 PM) Patient words: bariatric pre-op.  The patient is a 28 year old male who presents for a pre-op visit. This 28 year old male comes in today for his preoperative visit for laparoscopic adjustable gastric band surgery which is scheduled for November 30. I initially met him on September 3. His weight at that time was 316 pounds. His BMI was 44. He denies any medical changes since his initial visit to the office. He has stopped using tobacco products and stop using caffeinated products as well. His preoperative workup was essentially normal with a few exceptions. His upper GI was within normal limits. His chest x-ray was normal. His H. pylori antibody screen was negative. His hemoglobin A1c level was 5.7. His abdominal ultrasound revealed findings consistent with fatty liver. His conference and metabolic panel was normal with the exception of an ALT level of 69. His CBC and lipid panel was normal   Other Problems Gayland Curry, MD; 01/27/2014 3:02 PM) FATTY LIVER (571.8  K76.0) MORBID OBESITY, UNSPECIFIED OBESITY TYPE (278.01  E66.01) PREDIABETES (790.29  R73.09) Asthma  Past Surgical History Gayland Curry, MD; 01/27/2014 3:02 PM) Oral Surgery  Diagnostic Studies History Gayland Curry, MD; 01/27/2014 3:02 PM) Colonoscopy never  Allergies Gayland Curry, MD; 01/27/2014 3:02 PM) No Known Drug Allergies09/05/2013  Medication History Gayland Curry, MD; 01/27/2014 3:02 PM) OxyCODONE HCl (5MG/5ML Solution, 5-10 Milliliter Oral every four hours, as needed, Taken starting 01/27/2014) Active. Dulera (100-5MCG/ACT Aerosol, Inhalation daily) Active.  Social History Gayland Curry, MD; 01/27/2014 3:02 PM) Tobacco use Never smoker. No drug use Caffeine  use Carbonated beverages. Alcohol use Occasional alcohol use.  Family History Gayland Curry, MD; 01/27/2014 3:02 PM) First Degree Relatives No pertinent family history  Vitals ([place]; 01/27/2014 2:24 PM) 01/27/2014 2:23 PM Weight: 314 lb Height: 71in Body Surface Area: 2.67 m Body Mass Index: 43.79 kg/m Temp.: 2F(Temporal)  Pulse: 78 (Regular)  Resp.: 16 (Unlabored)  BP: 130/70 (Sitting, Left Arm, Standard)    Physical Exam Randall Hiss M. Leida Luton MD; 01/27/2014 2:57 PM) General Mental Status-Alert. General Appearance-Consistent with stated age. Hydration-Well hydrated. Voice-Normal.  Head and Neck Head-normocephalic, atraumatic with no lesions or palpable masses. Trachea-midline. Thyroid Gland Characteristics - normal size and consistency.  Eye Eyeball - Bilateral-Extraocular movements intact. Sclera/Conjunctiva - Bilateral-No scleral icterus.  Chest and Lung Exam Chest and lung exam reveals -quiet, even and easy respiratory effort with no use of accessory muscles and on auscultation, normal breath sounds, no adventitious sounds and normal vocal resonance. Inspection Chest Wall - Normal. Back - normal.  Breast - Did not examine.  Cardiovascular Cardiovascular examination reveals -normal heart sounds, regular rate and rhythm with no murmurs and normal pedal pulses bilaterally.  Abdomen Inspection Inspection of the abdomen reveals - No Hernias. Skin - Scar - no surgical scars. Palpation/Percussion Palpation and Percussion of the abdomen reveal - Soft, Non Tender, No Rebound tenderness, No Rigidity (guarding) and No hepatosplenomegaly. Auscultation Auscultation of the abdomen reveals - Bowel sounds normal.  Peripheral Vascular Upper Extremity Palpation - Pulses bilaterally normal.  Neurologic Neurologic evaluation reveals -alert and oriented x 3 with no impairment of recent or remote memory. Mental  Status-Normal.  Neuropsychiatric The patient's mood and affect are described as -normal. Judgment and Insight-insight is appropriate concerning matters relevant to  self.  Musculoskeletal Normal Exam - Left-Upper Extremity Strength Normal and Lower Extremity Strength Normal. Normal Exam - Right-Upper Extremity Strength Normal and Lower Extremity Strength Normal.  Lymphatic Head & Neck  General Head & Neck Lymphatics: Bilateral - Description - Normal. Axillary - Did not examine. Femoral & Inguinal - Did not examine.    Assessment & Plan Randall Hiss M. Kadir Azucena MD; 01/27/2014 3:01 PM) MORBID OBESITY, UNSPECIFIED OBESITY TYPE (278.01  E66.01) Impression: We reviewed his workup today. We rediscussed the typical hospital course as well as the course during surgery. We reviewed his abdominal ultrasound which showed fatty liver. I think this may explain his mildly elevated ALT level of 69. He will have a repeat set of labs prior to surgery. We discussed the typical postoperative course. The plan will be for him to go home same day. He was given his postoperative pain medicine prescription today. Current Plans  Instructions: Congratulations on starting your journey to a healthier life! Over the next few weeks you will be undergoing tests (x-rays and labs) and seeing specialists to help evaluate you for weight loss surgery. These tests and consultations with a psychologist and nutritionist are needed to prepare you for the lifestyle changes that lie ahead and are often required by insurance companies to approve you for surgery. Please call me if you have any questions during the evaluation.  Pathway to Surgery:   Two weeks prior to surgery Go on the extremely low carb liquid diet - this will decrease the size of your liver which will make surgery safer - the nutritionist will go over this at a later date Attend preoperative appointment with your surgeon Attend preoperative surgery  class  One week prior to surgery No aspirin products. Tylenol is acceptable  24 hours prior to surgery No alcoholic beverages Report fever greater than 100.5 or excessive nasal drainage suggesting infection Continue bariatric preop diet Perform bowel prep if ordered Do not eat or drink anything after midnight the night before surgery Do not take any medications except those instructed by the anesthesiologist  Morning of surgery Please arrive at the hospital at least 2 hours before your scheduled surgery time. No makeup, fingernail polish or jewelry Bring insurance cards with you Bring your CPAP mask if you use this Started OxyCODONE HCl 5MG/5ML, 5-10 Milliliter every four hours, as needed, 200 Milliliter, 01/27/2014, No Refill.  PREDIABETES (790.29  R73.09) Impression: His hemoglobin A1c level was 5.7. We discussed this as a marker of impaired fasting glucose. We discussed that this should improve with weight loss along with diet and exercise.  FATTY LIVER (571.8  K76.0)  Leighton Ruff. Redmond Pulling, MD, FACS General, Bariatric, & Minimally Invasive Surgery [facility], [place]

## 2014-02-07 NOTE — Progress Notes (Signed)
Patient is alert and oriented.  Pain is controlled, and patient is tolerating fluids.  Plan to advance to protein shake tomorrow.  Reviewed Adjustable gastric band discharge instructions with patient, patient able to articulate understanding.  Provided information on BELT program, Support Group and [facility] outpatient pharmacy. All questions answered, will continue to monitor.  

## 2014-02-07 NOTE — Anesthesia Preprocedure Evaluation (Addendum)
Anesthesia Evaluation  Patient identified by MRN, date of birth, ID band Patient awake    Reviewed: Allergy & Precautions, H&P , NPO status , Patient's Chart, lab work & pertinent test results  Airway Mallampati: III  TM Distance: >3 FB Neck ROM: full    Dental no notable dental hx. (+) Teeth Intact, Dental Advisory Given   Pulmonary neg pulmonary ROS, asthma ,  breath sounds clear to auscultation  Pulmonary exam normal       Cardiovascular Exercise Tolerance: Good negative cardio ROS  Rhythm:regular Rate:Normal     Neuro/Psych Bell's palsy negative neurological ROS  negative psych ROS   GI/Hepatic negative GI ROS, Neg liver ROS,   Endo/Other  negative endocrine ROSMorbid obesity  Renal/GU negative Renal ROS  negative genitourinary   Musculoskeletal   Abdominal (+) + obese,   Peds  Hematology negative hematology ROS (+)   Anesthesia Other Findings   Reproductive/Obstetrics negative OB ROS                           Anesthesia Physical Anesthesia Plan  ASA: III  Anesthesia Plan: General   Post-op Pain Management:    Induction: Intravenous  Airway Management Planned: Oral ETT  Additional Equipment:   Intra-op Plan:   Post-operative Plan: Extubation in OR  Informed Consent: I have reviewed the patients History and Physical, chart, labs and discussed the procedure including the risks, benefits and alternatives for the proposed anesthesia with the patient or authorized representative who has indicated his/her understanding and acceptance.   Dental Advisory Given  Plan Discussed with: CRNA and Surgeon  Anesthesia Plan Comments:       Anesthesia Quick Evaluation

## 2014-02-07 NOTE — Interval H&P Note (Signed)
History and Physical Interval Note:  02/07/2014 9:44 AM  Dustin SarMichael T Falzon Jr.  has presented today for surgery, with the diagnosis of Morbid Obesity  The various methods of treatment have been discussed with the patient and family. After consideration of risks, benefits and other options for treatment, the patient has consented to  Procedure(s): LAPAROSCOPIC GASTRIC BANDING (N/A) as a surgical intervention .  The patient's history has been reviewed, patient examined, no change in status, stable for surgery.  I have reviewed the patient's chart and labs.  Questions were answered to the patient's satisfaction.    Mary SellaEric M. Andrey CampanileWilson, MD, FACS General, Bariatric, & Minimally Invasive Surgery [facility], [place]    [facility] Uro[facility] LtdWILSON,Luca Burston M

## 2014-02-07 NOTE — Discharge Instructions (Signed)
° °                ° °ADJUSTABLE GASTRIC BAND ° Home Care Instructions ° ° These instructions are to help you care for yourself when you go home. ° °Call: If you have any problems. °• Call [phone] and ask for the surgeon on call °• If you need immediate assistance come to the ER at [facility]. Tell the ER staff you are a new post-op gastric banding patient  °Signs and symptoms to report: • Severe  vomiting or nausea °o If you cannot handle clear liquids for longer than 1 day, call your surgeon °• Abdominal pain which does not get better after taking your pain medication °• Fever greater than 100.4°  F and chills °• Heart rate over 100 beats a minute °• Trouble breathing °• Chest pain °• Redness,  swelling, drainage, or foul odor at incision (surgical) sites °• If your incisions open or pull apart °• Swelling or pain in calf (lower leg) °• Diarrhea (Loose bowel movements that happen often), frequent watery, uncontrolled bowel movements °• Constipation, (no bowel movements for 3 days) if this happens: °o Take Milk of Magnesia, 2 tablespoons by mouth, 3 times a day for 2 days if needed °o Stop taking Milk of Magnesia once you have had a bowel movement °o Call your doctor if constipation continues °Or °o Take Miralax  (instead of Milk of Magnesia) following the label instructions °o Stop taking Miralax once you have had a bowel movement °o Call your doctor if constipation continues °• Anything you think is “abnormal for you” °  °Normal side effects after surgery: • Unable to sleep at night or unable to concentrate °• Irritability °• Being tearful (crying) or depressed ° °These are common complaints, possibly related to your anesthesia, stress of surgery, and change in lifestyle, that usually go away a few weeks after surgery. If these feelings continue, call your medical doctor.  °Wound Care: You may have surgical glue, steri-strips, or staples over your incisions after surgery °• Surgical glue: Looks like clear  film over your incisions and will wear off a little at a time °• Steri-strips: Adhesive strips of tape over your incisions. You may notice a yellowish color on skin under the steri-strips. This is used to make the steri-strips stick better. Do not pull the steri-strips off - let them fall off °• Staples: Staples may be removed before you leave the hospital °o If you go home with staples, call [facility] for an appointment with your surgeon’s nurse to have staples removed 10 days after surgery, [phone] °• Showering: You may shower two (2) days after your surgery unless your surgeon tells you differently °o Wash gently around incisions with warm soapy water, rinse well, and gently pat dry °o If you have a drain (tube from your incision), you may need someone to hold this while you shower °o No tub baths until staples are removed and incisions are healed °  °Medications: • Medications should be liquid or crushed if larger than the size of a dime °• Extended release pills (medication that releases a little bit at a time through the  day) should not be crushed °• Depending on the size and number of medications you take, you may need to space (take a few throughout the day)/change the time you take your medications so that you do not over-fill your pouch (smaller stomach) °• Make sure you follow-up with you primary care physician   to make medication changes needed during rapid weight loss and life -style changes °• If you have diabetes, follow up with your doctor that orders your diabetes medication(s) within one week after surgery and check your blood sugar regularly ° °• Do not drive while taking narcotics (pain medications) ° °• Do not take acetaminophen (Tylenol) and Roxicet or Lortab Elixir at the same time since these pain medications contain acetaminophen °  °Diet:  °First 2 Weeks You will see the nutritionist about two (2) weeks after your surgery. The nutritionist will increase the types of  foods you can eat if you are handling liquids well: °• If you have severe vomiting or nausea and cannot handle clear liquids lasting longer than 1 day call your surgeon °For Same Day Surgery Discharge Patients: °• The day of surgery drink water only: 2 ounces every 4 hours °• If you are handling water, start drinking your high protein shake the next morning °For Overnight Stay Patients: °• Begin by drinking 2 ounces of a high protein every 3 hours, 5-6 times per day °• Slowly increase the amount you drink as tolerated °• You may find it easier to slowly sip shakes throughout the day. It is important to get your proteins in first °  ° Protein Shake °• Drink at least 2 ounces of shake 5-6 times per day °• Each serving of protein shakes (usually 8-12 ounces) should have a minimum of: °o 15 grams of protein °o And no more than 5 grams of carbohydrate °• Goal for protein each day: °o Men = 80 grams per day °o Women = 60 grams per day °• Protein powder may be added to fluids such as non-fat milk or Lactaid milk or Soy milk (limit to 35 grams added protein powder per serving) ° °Hydration °• Slowly increase the amount of water and other clear liquids as tolerated (See Acceptable Fluids) °• Slowly increase the amount of protein shake as tolerated °• Sip fluids slowly and throughout the day °• May use sugar substitutes in small amounts (no more than 6-8 packets per day; i.e. Splenda) ° °Fluid Goal °• The first goal is to drink at least 8 ounces of protein shake/drink per day (or as directed by the nutritionist); some examples of protein shakes are Syntrax, Nectar, Adkins Advantage, EAS Edge HP, and Unjury. - See handout from pre-op Bariatric Education Class: °o Slowly increase the amount of protein shake you drink as tolerated °o You may find it easier to slowly sip shakes throughout the day °o It is important to get your proteins in first °• Your fluid goal is to drink 64-100 ounces of fluid daily °o It may take a few weeks  to build up to this  °• 32 oz. (or more) should be full liquids (see below for examples) °• Liquids should not contain sugar, caffeine, or carbonation ° °Clear Liquids: °• Water of Sugar-free flavored water (i.e. Fruit H²O, Propel) °• Decaffeinated coffee or tea (sugar-free) °• Crystal lite, Wyler’s Lite, Minute Maid Lite °• Sugar-free Jell-O °• Bouillon or broth °• Sugar-free Popsicle:    - Less than 20 calories each; Limit 1 per day ° ° ° ° °  ° Full Liquids: °                  Protein Shakes/Drinks + 2 choices per day of other full liquids °• Full liquids must be: °o No More Than 12 grams of Carbs per serving °o No More Than 3 grams   of Fat per serving °• Strained low-fat cream soup °• Non-Fat milk °• Fat-free Lactaid Milk °• Sugar-free yogurt (Dannon Lite & Fit, Greek yogurt) °  °Vitamins and Minerals • Start 1 day after surgery unless otherwise directed by your surgeon °• 1 Chewable Multivitamin / Multimineral Supplement with iron (i.e. Centrum for Adults) °• Chewable Calcium Citrate with Vitamin D-3 °(Example: 3 Chewable Calcium  Plus 600 with vitamin D-3) °o Take 500 mg three (3) times a day for a total of 1500 mg per day °o Do not take all 3 doses of calcium at one time as it may cause constipation, and you can only absorb 500 mg at a time °o Do not mix multivitamins containing iron with calcium supplements;  take 2 hours apart °o Do not substitute Tums (calcium carbonate) for your calcium °• Menstruating women and those at risk for anemia ( a blood disease that causes weakness) may need extra iron °o Talk to your doctor to see if you need more iron °• If you need extra iron: total daily iron recommendation (including Vitamins) is 50 to 100 mg Iron/day °• Do not stop taking or change any vitamins or minerals until you talk to your nutritionist or surgeon °• Your nutritionist and/or surgeon must approve all vitamin and mineral supplements  °Activity and Exercise: It is important to continue walking at home.  Limit your physical activity as instructed by your doctor. During this time, use these guidelines: °• Do not lift anything greater than ten  (10) pounds for at least two (2) weeks °• Do not go back to work or drive until your surgeon says you can °• You may have sex when you feel comfortable °o It is VERY important for male patients to use a reliable birth control method; fertility often increase after surgery °o Do not get pregnant for at least 18 months °• Start exercising as soon as your doctor tells you that you can °o Make sure your doctor approves any physical activity °• Start with a simple walking program °• Walk 5-15 minutes each day, 7 days per week °• Slowly increase until you are walking 30-45 minutes per day °• Consider joining our BELT program. [phone] or email [email or web address] ° °  °Special Instructions  Things to remember: °• Free counseling is available for you and your family through collaboration between [facility] and [employer]. Please call [phone] and leave a message °• Use your CPAP when sleeping if this applies to you °• Consider buying a medical alert bracelet that says you had lap-band surgery °• You will likely have your first fill (fluid added to your band) 6 - 8 weeks after surgery °• [facility] has a free Bariatric Surgery Support Group that meets monthly, the 3rd Thursday, 6pm. [facility]. You can see classes online at [email or web address] °• It is very important to keep all follow up appointments with your surgeon, nutritionist, primary care physician, and behavioral health practitioner °o After the first year, please follow up with your bariatric surgeon and nutritionist at least once a year in order to maintain best weight loss results °      °             [facility]:  [phone] ° °             [facility]: [phone] ° °             Bariatric Nurse Coordinator:   [phone] °  °   Adjustable Gastric Band Home Care Instructions  Rev. 04/2012                                                            ° °     Reviewed and Endorsed °                                                  by [facility] Patient Education Committee, Jan, 2014 ° °

## 2014-02-07 NOTE — Progress Notes (Signed)
Call to Skip EstimableLaurie Deaton, RN, bariatric nurse. States she will come and see patient prior to DC

## 2014-02-07 NOTE — Anesthesia Postprocedure Evaluation (Signed)
  Anesthesia Post-op Note  Patient: Dustin SarMichael T Emmanuel Jr.  Procedure(s) Performed: Procedure(s) (LRB): LAPAROSCOPIC GASTRIC BANDING (N/A)  Patient Location: PACU  Anesthesia Type: General  Level of Consciousness: awake and alert   Airway and Oxygen Therapy: Patient Spontanous Breathing  Post-op Pain: mild  Post-op Assessment: Post-op Vital signs reviewed, Patient's Cardiovascular Status Stable, Respiratory Function Stable, Patent Airway and No signs of Nausea or vomiting  Last Vitals:  Filed Vitals:   02/07/14 1245  BP: 141/81  Pulse:   Temp: 36.6 C  Resp:     Post-op Vital Signs: stable   Complications: No apparent anesthesia complications

## 2014-02-08 ENCOUNTER — Encounter (HOSPITAL_COMMUNITY): Payer: Self-pay | Admitting: General Surgery

## 2014-02-09 ENCOUNTER — Telehealth (HOSPITAL_COMMUNITY): Payer: Self-pay

## 2014-02-09 NOTE — Telephone Encounter (Signed)
  Made discharge phone call to patient per DROP protocol. Asking the following questions.    1. Do you have someone to care for you now that you are home?  yes 2. Are you having pain now that is not relieved by your pain medication?  no 3. Are you able to drink the recommended daily amount of fluids (48 ounces minimum/day) and protein (60-80 grams/day) as prescribed by the dietitian or nutritional counselor?  yes 4. Are you taking the vitamins and minerals as prescribed?  Yes, Calcium citrate is a little big, but its ok 5. Do you have the "on call" number to contact your surgeon if you have a problem or question?  yes 6. Are your incisions free of redness, swelling or drainage? (If steri strips, address that these can fall off, shower as tolerated) yes 7. Have your bowels moved since your surgery?  If not, are you passing gas?  No/yes, but took milk of magnesia this morning but it hasn't worked yet 8. Are you up and walking 3-4 times per day?  i walked 5 miles yesterday and today    1. Do you have an appointment made to see your surgeon in the next month?  yes 2. Were you provided your discharge medications before your surgery or before you were discharged from the hospital and are you taking them without problem?  yes 3. Were you provided phone numbers to the clinic/surgeon's office?  yes 4. Did you watch the patient education video module in the (clinic, surgeon's office, etc.) before your surgery? yes 5. Do you have a discharge checklist that was provided to you in the hospital to reference with instructions on how to take care of yourself after surgery?  yes 6. Did you see a dietitian or nutritional counselor while you were in the hospital?  yes 7. Do you have an appointment to see a dietitian or nutritional counselor in the next month? yes

## 2014-02-22 ENCOUNTER — Encounter: Payer: BC Managed Care – PPO | Attending: General Surgery

## 2014-02-22 DIAGNOSIS — Z6841 Body Mass Index (BMI) 40.0 and over, adult: Secondary | ICD-10-CM | POA: Diagnosis not present

## 2014-02-22 DIAGNOSIS — E669 Obesity, unspecified: Secondary | ICD-10-CM | POA: Diagnosis not present

## 2014-02-22 DIAGNOSIS — Z713 Dietary counseling and surveillance: Secondary | ICD-10-CM | POA: Diagnosis not present

## 2014-02-22 NOTE — Progress Notes (Signed)
Bariatric Class:  Appt start time: 1530 end time:  1630.  2 Week Post-Operative Nutrition Class  Patient was seen on 02/22/14 for Post-Operative Nutrition education at [facility].   Surgery date: 02/07/14 Surgery type: LAGB Start weight at [facility]: 319 lbs on 11/20/13 Weight today: 287.5 lbs  Weight change: 28.5 lbs  TANITA  BODY COMP RESULTS  01/17/14 02/22/14   BMI (kg/m^2) 44.1 40.1   Fat Mass (lbs) 181 146.5   Fat Free Mass (lbs) 135 141.0   Total Body Water (lbs) 99 103.0    The following the learning objectives were met by the patient during this course:  Identifies Phase 3A (Soft, High Proteins) Dietary Goals and will begin from 2 weeks post-operatively to 2 months post-operatively  Identifies appropriate sources of fluids and proteins   States protein recommendations and appropriate sources post-operatively  Identifies the need for appropriate texture modifications, mastication, and bite sizes when consuming solids  Identifies appropriate multivitamin and calcium sources post-operatively  Describes the need for physical activity post-operatively and will follow MD recommendations  States when to call healthcare provider regarding medication questions or post-operative complications  Handouts given during class include:  Phase 3A: Soft, High Protein Diet Handout  Follow-Up Plan: Patient will follow-up at [facility] in 6 weeks for 2 month post-op nutrition visit for diet advancement per MD.    

## 2014-03-24 ENCOUNTER — Encounter: Payer: BLUE CROSS/BLUE SHIELD | Attending: General Surgery | Admitting: Dietician

## 2014-03-24 VITALS — Ht 71.0 in | Wt 271.0 lb

## 2014-03-24 DIAGNOSIS — Z6841 Body Mass Index (BMI) 40.0 and over, adult: Secondary | ICD-10-CM | POA: Diagnosis not present

## 2014-03-24 DIAGNOSIS — Z713 Dietary counseling and surveillance: Secondary | ICD-10-CM | POA: Diagnosis not present

## 2014-03-24 DIAGNOSIS — E669 Obesity, unspecified: Secondary | ICD-10-CM | POA: Diagnosis present

## 2014-03-24 NOTE — Progress Notes (Signed)
  Follow-up visit:  6 Weeks Post-Operative LAGB Surgery  Medical Nutrition Therapy:  Appt start time: 405 end time:  430  Primary concerns today: Post-operative Bariatric Surgery Nutrition Management. Dustin Mccall returns today having lost 16 pounds of fat in the last month. One cc of fluid was added to his band last week. Can tolerate all recommended foods and has had no regurgitation.   Surgery date: 02/07/14 Surgery type: LAGB [facility] on 11/20/13 Weight today: 271 lbs  Weight change: 16 lbs Total weight lost: 40 lbs  TANITA  BODY COMP RESULTS  01/17/14 02/22/14 03/24/14   BMI (kg/m^2) 44.1 40.1 37.8   Fat Mass (lbs) 181 146.5 130   Fat Free Mass (lbs) 135 141.0 141   Total Body Water (lbs) 99 103.0 103    Preferred Learning Style:   No preference indicated   Learning Readiness:  Ready  24-hr recall: B (AM): Premier protein shake (30g) OR 2 egg omelet with low fat cheese and sliced chicken (20g) Snk (AM): none  L (PM): 3 oz shredded chicken or chili (21g) Snk (PM): none, sometimes [place]key strips or Babybel cheese (7g) D (PM): alternates with breakfast (20-30g) Snk (PM):   Fluid intake: 72 oz per patient report; water with sugar free flavoring Estimated total protein intake: 70-80 grams per day  Medications: see list, no changes Supplementation: taking  Drinking while eating: no Hair loss: no Carbonated beverages: sugar free Red Bull N/V/D/C: constipation, resolved with Miralax Dumping syndrome: no Last Lap-Band fill: 1cc last week  Recent physical activity:  Walking 5x a day; gym 3x a week (1 hour on the elliptical and free weights)  Progress Towards Goal(s):  In progress.  Handouts given during visit include:  Phase 3B lean protein + non starchy vegetables   Nutritional Diagnosis:  [place]-3.3 Overweight/obesity related to past poor dietary habits and physical inactivity as evidenced by patient w/ recent LAGB surgery following dietary guidelines  for continued weight loss.     Intervention:  Nutrition counseling provided.  Teaching Method Utilized:  Visual Auditory  Barriers to learning/adherence to lifestyle change: none  Demonstrated degree of understanding via:  Teach Back   Monitoring/Evaluation:  Dietary intake, exercise, lap band fills, and body weight. Follow up in 3 months for 4.5 month post-op visit.

## 2014-03-24 NOTE — Patient Instructions (Signed)
Surgery date: 02/07/14 Surgery type: [facility]AGB [facility] on 11/20/13 Weight today: 271 lbs  Weight change: 16 lbs Total weight lost: 40 lbs  TANITA  BODY COMP RESULTS  01/17/14 02/22/14 03/24/14   BMI (kg/m^2) 44.1 40.1 37.8   Fat Mass (lbs) 181 146.5 130   Fat Free Mass (lbs) 135 141.0 141   Total Body Water (lbs) 99 103.0 103

## 2014-06-23 ENCOUNTER — Ambulatory Visit: Payer: BLUE CROSS/BLUE SHIELD | Admitting: Dietician

## 2014-06-28 ENCOUNTER — Encounter: Payer: BLUE CROSS/BLUE SHIELD | Attending: General Surgery | Admitting: Dietician

## 2014-06-28 DIAGNOSIS — Z713 Dietary counseling and surveillance: Secondary | ICD-10-CM | POA: Insufficient documentation

## 2014-06-28 DIAGNOSIS — Z6833 Body mass index (BMI) 33.0-33.9, adult: Secondary | ICD-10-CM | POA: Diagnosis not present

## 2014-06-28 NOTE — Progress Notes (Signed)
  Follow-up visit:  5 months Post-Operative LAGB Surgery  Medical Nutrition Therapy:  Appt start time: 235 end time:  255  Primary concerns today: Post-operative Bariatric Surgery Nutrition Management. Dustin Mccall returns today having lost another 28.5 lbs. He has not had another band fill. He states he feels some restriction but has not had any regurgitation. Feels like his weight has plateaued. Prepares meals ahead of time.    Surgery date: 02/07/14 [facility]20/13 Weight today: 242.5 lbs Weight change: 28.5 lbs Total weight lost: 76.5 lbs  Goal weight: 200-220 lbs  TANITA  BODY COMP RESULTS  01/17/14 02/22/14 03/24/14 06/28/14   BMI (kg/m^2) 44.1 40.1 37.8 33.8   Fat Mass (lbs) 181 146.5 130 77   Fat Free Mass (lbs) 135 141.0 141 165.5   Total Body Water (lbs) 99 103.0 103 121    Preferred Learning Style:   No preference indicated   Learning Readiness:  Ready  24-hr recall: B (AM): Premier protein shake (30g) OR 2 egg omelet with low fat cheese and sliced chicken (20g) Snk (AM): none  L (PM): 3 oz shredded chicken or chili or burger wrapped in lettuce (21g) Snk (PM): none, sometimes Babybel cheese (7g) D (PM): alternates with breakfast (20-30g) Snk (PM):   Fluid intake: 72 oz per patient report; water with sugar free flavoring Estimated total protein intake: 70-80 grams per day  Medications: see list, no changes Supplementation: taking  Drinking while eating: no Hair loss: no Carbonated beverages: sugar free Red Bull N/V/D/C: constipation, resolved with Miralax 2x a week Dumping syndrome: no Last Lap-Band fill: 1cc in January 2016  Recent physical activity:  gym 3x a week (1 hour on the elliptical + free weights)  Progress Towards Goal(s):  In progress.   Nutritional Diagnosis:  [place]-3.3 Overweight/obesity related to past poor dietary habits and physical inactivity as evidenced by patient w/ recent LAGB surgery following  dietary guidelines for continued weight loss.     Intervention:  Nutrition counseling provided.  Teaching Method Utilized:  Visual Auditory  Barriers to learning/adherence to lifestyle change: none  Demonstrated degree of understanding via:  Teach Back   Monitoring/Evaluation:  Dietary intake, exercise, lap band fills, and body weight. Follow up in 3 months for 8 month post-op visit.

## 2014-06-28 NOTE — Patient Instructions (Addendum)
Surgery date: 02/07/14 Surgery type: [facility]AGB [facility]/13 Weight today: 242.5 lbs Weight change: 28.5 lbs Total weight lost: 76.5 lbs  Goal weight: 200-220 lbs   TANITA  BODY COMP RESULTS  01/17/14 02/22/14 03/24/14 06/28/14   BMI (kg/m^2) 44.1 40.1 37.8 33.8   Fat Mass (lbs) 181 146.5 130 77   Fat Free Mass (lbs) 135 141.0 141 165.5   Total Body Water (lbs) 99 103.0 103 121

## 2014-09-27 ENCOUNTER — Encounter: Payer: BLUE CROSS/BLUE SHIELD | Attending: General Surgery | Admitting: Dietician

## 2014-09-27 DIAGNOSIS — Z48815 Encounter for surgical aftercare following surgery on the digestive system: Secondary | ICD-10-CM | POA: Insufficient documentation

## 2014-09-27 DIAGNOSIS — Z9884 Bariatric surgery status: Secondary | ICD-10-CM | POA: Insufficient documentation

## 2014-09-27 DIAGNOSIS — Z6834 Body mass index (BMI) 34.0-34.9, adult: Secondary | ICD-10-CM | POA: Insufficient documentation

## 2014-09-27 DIAGNOSIS — Z713 Dietary counseling and surveillance: Secondary | ICD-10-CM | POA: Insufficient documentation

## 2014-09-27 NOTE — Patient Instructions (Signed)
-  Make an appointment for a band fill -Work on eating slowly, chewing thoroughly, and taking tiny bites

## 2014-09-27 NOTE — Progress Notes (Signed)
  Follow-up visit:  8 months Post-Operative LAGB Surgery  Medical Nutrition Therapy:  Appt start time: 235 end time:  250  Primary concerns today: Post-operative Bariatric Surgery Nutrition Management. Dustin Mccall returns today having gained 2 lbs. He has not had another band fill and has not been exercising as much. He feels like he needs to get another fill; not having as much restriction. Otherwise meals havent changed. Has not had any regurgitation. Finds he can eat a large amount, similar to before he has surgery.   Surgery date: 02/07/14 Surgery type: [facility]B [facility] lbs on 11/20/13 Weight today: 244.5 lbs Weight change: 2 lbs gain Total weight lost: 74.5 lbs  Goal weight: 200-220 lbs  TANITA  BODY COMP RESULTS  01/17/14 02/22/14 03/24/14 06/28/14 09/27/14   BMI (kg/m^2) 44.1 40.1 37.8 33.8 34.1   Fat Mass (lbs) 181 146.5 130 77 94   Fat Free Mass (lbs) 135 141.0 141 165.5 150.5   Total Body Water (lbs) 99 103.0 103 121 110    Preferred Learning Style:   No preference indicated   Learning Readiness:  Ready  24-hr recall: B (AM): Premier protein shake (30g) OR 2 egg omelet with low fat cheese and sliced chicken (20g) Snk (AM): none  L (PM): 3 oz shredded chicken or chili or burger wrapped in lettuce (21g) Snk (PM): none, sometimes Babybel cheese (7g) D (PM): alternates with breakfast (20-30g) Snk (PM):   Fluid intake: 72 oz per patient report; water with sugar free flavoring; occasional sugar free Red Bull Estimated total protein intake: 70-80 grams per day  Medications: see list, no changes Supplementation: taking  Drinking while eating: no Hair loss: no Carbonated beverages: sugar free Red Bull N/V/D/C: intermittent constipation, resolved with Miralax 2x a week Dumping syndrome: no Last Lap-Band fill: 1cc in January 2016  Recent physical activity:  gym 3x a week (45 min on the elliptical + free weights)  Progress Towards Goal(s):  In  progress.   Nutritional Diagnosis:  [place]-3.3 Overweight/obesity related to past poor dietary habits and physical inactivity as evidenced by patient w/ recent LAGB surgery following dietary guidelines for continued weight loss.     Intervention:  Nutrition counseling provided.  Teaching Method Utilized:  Visual Auditory  Barriers to learning/adherence to lifestyle change: none  Demonstrated degree of understanding via:  Teach Back   Monitoring/Evaluation:  Dietary intake, exercise, lap band fills, and body weight. Follow up in 6 months for 14 month post-op visit.

## 2014-09-29 ENCOUNTER — Encounter: Payer: Self-pay | Admitting: Dietician

## 2015-03-30 ENCOUNTER — Ambulatory Visit: Payer: BLUE CROSS/BLUE SHIELD | Admitting: Dietician

## 2015-12-14 IMAGING — RF DG UGI W/ KUB
12 series · 12 of 12 positions shown · non-contrast
Comparison: None.

CLINICAL DATA: Bariatric screening

EXAM:
UPPER GI SERIES WITH KUB
TECHNIQUE: After obtaining a scout radiograph a routine upper GI series was
performed using thin and thick barium
FLUOROSCOPY TIME:  1 min 18 seconds

[Series 1: run · 1 of 1 slices shown (1 of 10)]
[im 1/1]
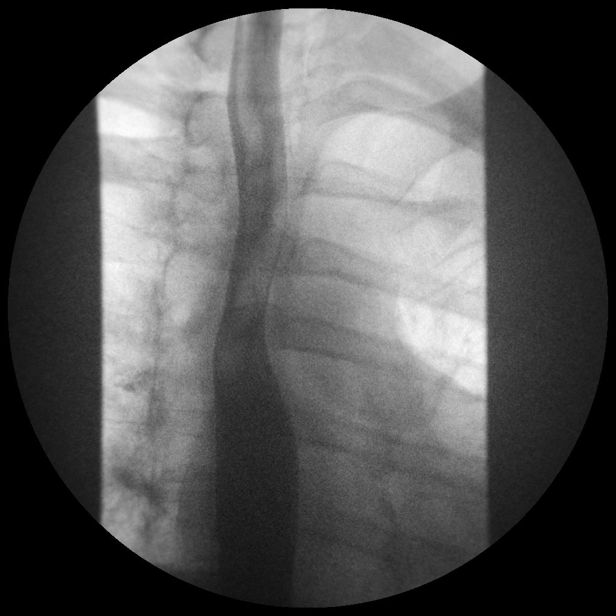

[Series 2: run · 1 of 1 slices shown (2 of 10)]
[im 1/1]
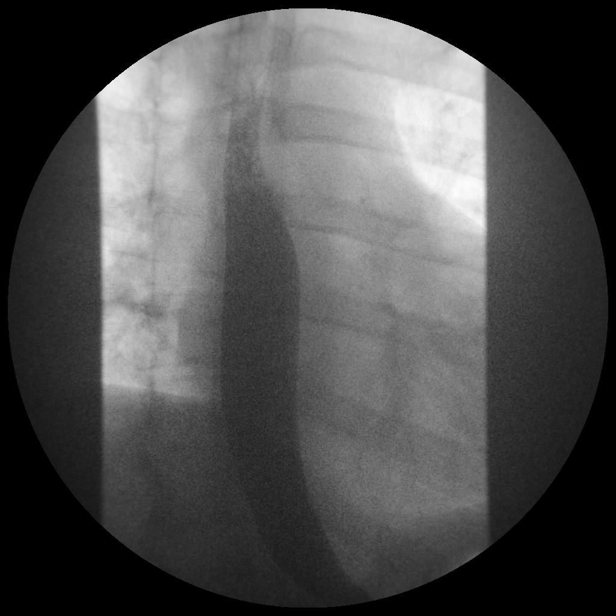

[Series 3: run · 1 of 1 slices shown (3 of 10)]
[im 1/1]
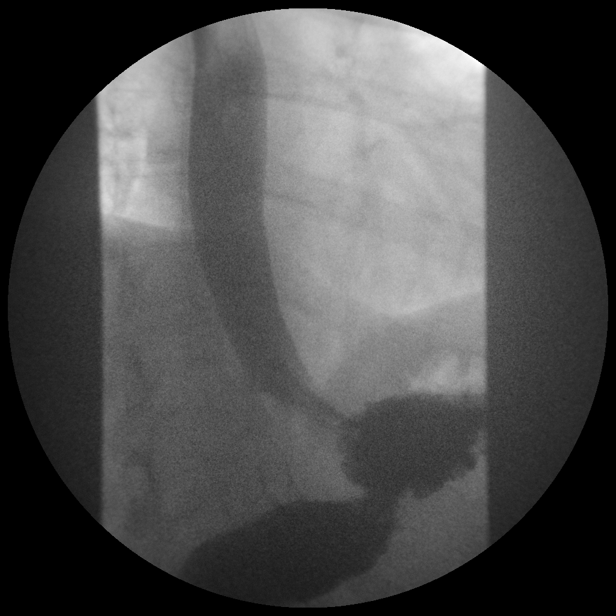

[Series 4: run · 1 of 1 slices shown (4 of 10)]
[im 1/1]
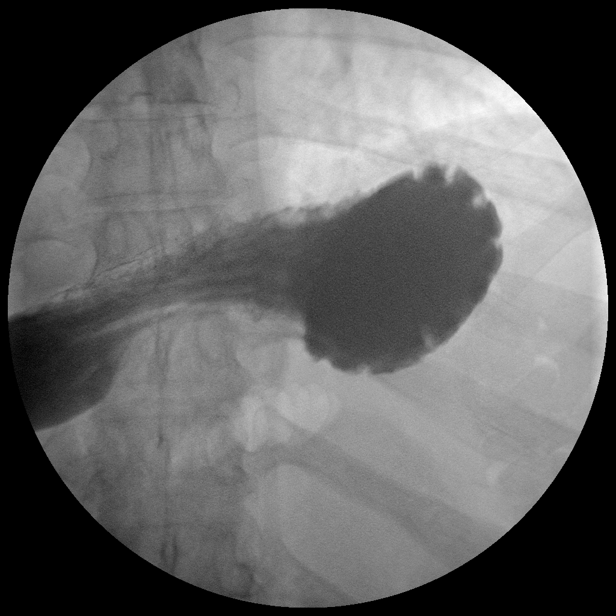

[Series 5: run · 1 of 1 slices shown (5 of 10)]
[im 1/1]
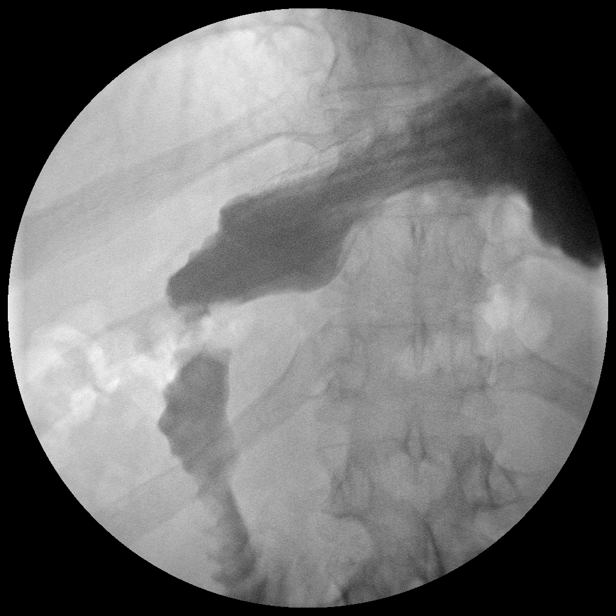

[Series 6: run · 1 of 1 slices shown (6 of 10)]
[im 1/1]
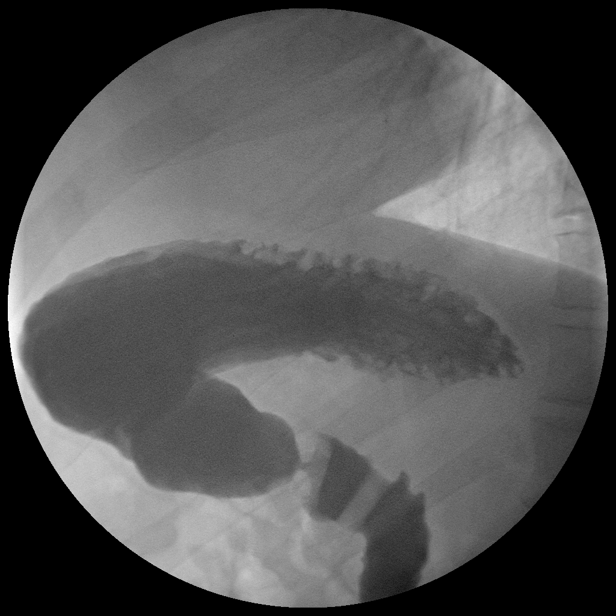

[Series 7: run · 1 of 1 slices shown (7 of 10)]
[im 1/1]
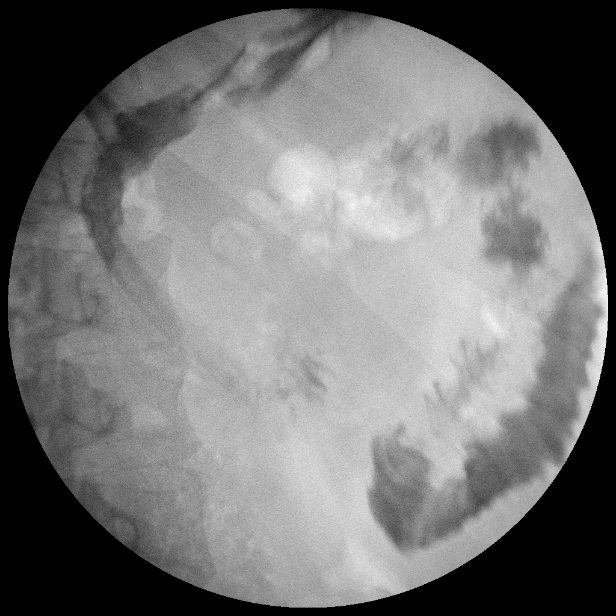

[Series 8: run · 1 of 1 slices shown (8 of 10)]
[im 1/1]
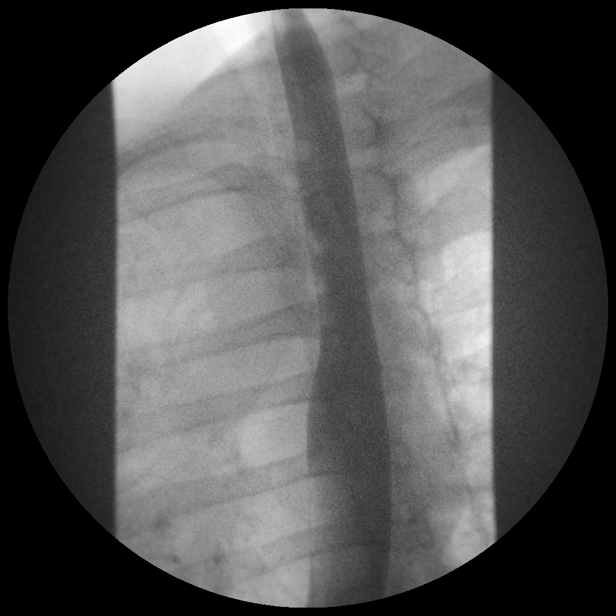

[Series 9: run · 1 of 1 slices shown (9 of 10)]
[im 1/1]
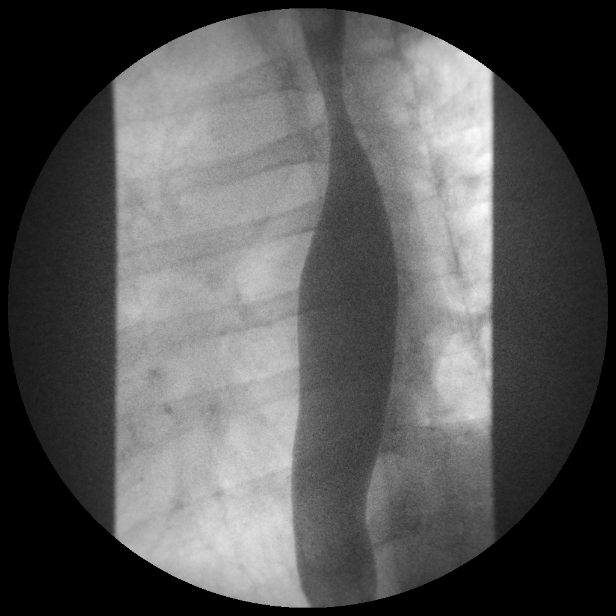

[Series 10: run · 1 of 1 slices shown (10 of 10)]
[im 1/1]
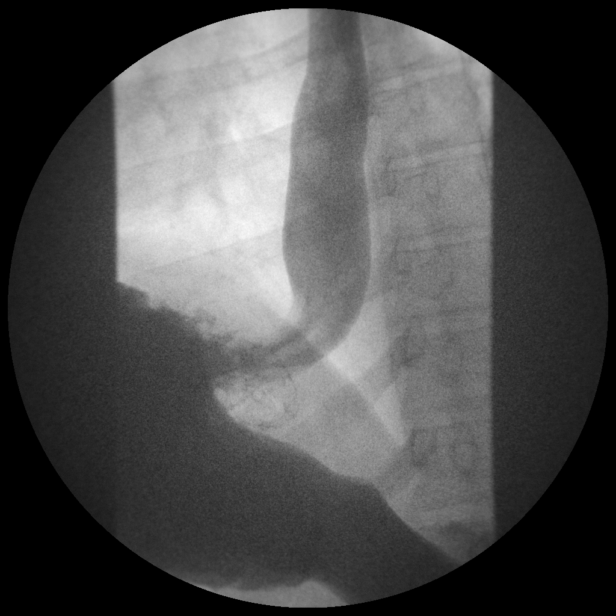

[Series 1001: view not recorded · 0.20mm/px · 1 of 1 slices shown (1 of 2)]
[im 1/1]
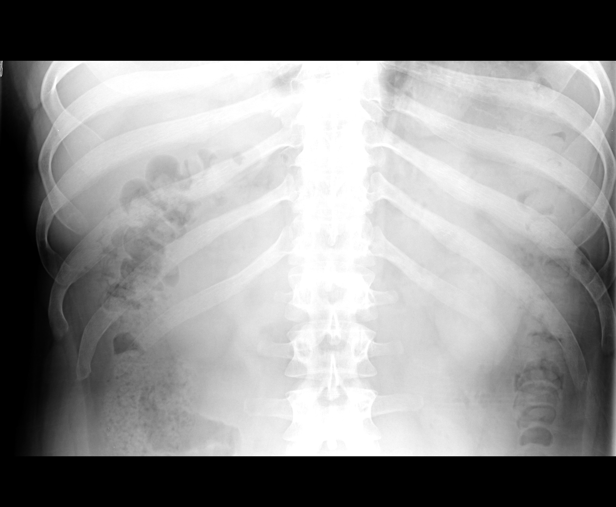

[Series 1002: view not recorded · 0.20mm/px · 1 of 1 slices shown (2 of 2)]
[im 1/1]
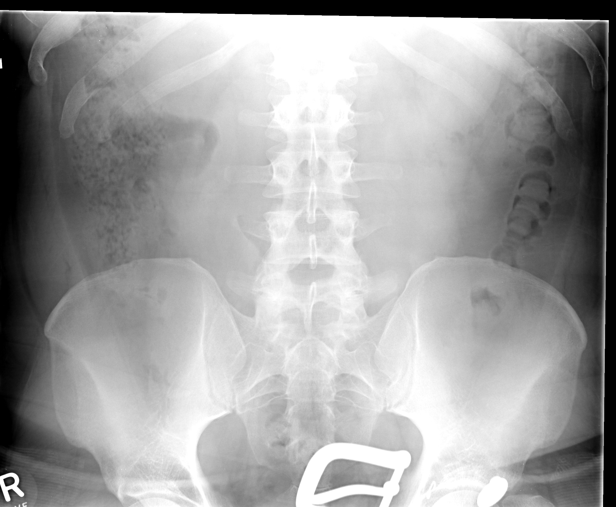

[12 of 12 positions shown; findings below may reference images not displayed]

FINDINGS: The scout radiograph is unremarkable.

The esophagus is patent and has a normal course and caliber.

The stomach, duodenal bulb and duodenal sweep are normal.

No reflux identified.  No hiatal hernia noted.
IMPRESSION: Normal exam.

## 2016-05-10 DIAGNOSIS — Z0000 Encounter for general adult medical examination without abnormal findings: Secondary | ICD-10-CM | POA: Diagnosis not present

## 2016-05-17 DIAGNOSIS — Z6830 Body mass index (BMI) 30.0-30.9, adult: Secondary | ICD-10-CM | POA: Diagnosis not present

## 2016-05-17 DIAGNOSIS — Z1389 Encounter for screening for other disorder: Secondary | ICD-10-CM | POA: Diagnosis not present

## 2016-05-17 DIAGNOSIS — Z0000 Encounter for general adult medical examination without abnormal findings: Secondary | ICD-10-CM | POA: Diagnosis not present

## 2016-05-17 DIAGNOSIS — E668 Other obesity: Secondary | ICD-10-CM | POA: Diagnosis not present

## 2016-05-17 DIAGNOSIS — R945 Abnormal results of liver function studies: Secondary | ICD-10-CM | POA: Diagnosis not present

## 2016-05-17 DIAGNOSIS — Z9884 Bariatric surgery status: Secondary | ICD-10-CM | POA: Diagnosis not present

## 2016-07-18 DIAGNOSIS — R7989 Other specified abnormal findings of blood chemistry: Secondary | ICD-10-CM | POA: Diagnosis not present

## 2016-07-18 DIAGNOSIS — R1032 Left lower quadrant pain: Secondary | ICD-10-CM | POA: Diagnosis not present

## 2016-07-18 DIAGNOSIS — R110 Nausea: Secondary | ICD-10-CM | POA: Diagnosis not present

## 2016-09-12 ENCOUNTER — Encounter (HOSPITAL_COMMUNITY): Payer: Self-pay

## 2016-11-18 DIAGNOSIS — Z4651 Encounter for fitting and adjustment of gastric lap band: Secondary | ICD-10-CM | POA: Diagnosis not present

## 2017-05-14 DIAGNOSIS — Z0000 Encounter for general adult medical examination without abnormal findings: Secondary | ICD-10-CM | POA: Diagnosis not present

## 2017-06-06 DIAGNOSIS — Z1389 Encounter for screening for other disorder: Secondary | ICD-10-CM | POA: Diagnosis not present

## 2017-06-06 DIAGNOSIS — Z8719 Personal history of other diseases of the digestive system: Secondary | ICD-10-CM | POA: Diagnosis not present

## 2017-06-06 DIAGNOSIS — Z0000 Encounter for general adult medical examination without abnormal findings: Secondary | ICD-10-CM | POA: Diagnosis not present

## 2017-06-06 DIAGNOSIS — J4540 Moderate persistent asthma, uncomplicated: Secondary | ICD-10-CM | POA: Diagnosis not present

## 2017-06-06 DIAGNOSIS — E668 Other obesity: Secondary | ICD-10-CM | POA: Diagnosis not present

## 2017-06-06 DIAGNOSIS — Z9884 Bariatric surgery status: Secondary | ICD-10-CM | POA: Diagnosis not present

## 2018-06-05 DIAGNOSIS — Z0000 Encounter for general adult medical examination without abnormal findings: Secondary | ICD-10-CM | POA: Diagnosis not present

## 2018-06-08 DIAGNOSIS — R82998 Other abnormal findings in urine: Secondary | ICD-10-CM | POA: Diagnosis not present

## 2018-06-12 DIAGNOSIS — Z1331 Encounter for screening for depression: Secondary | ICD-10-CM | POA: Diagnosis not present

## 2018-06-12 DIAGNOSIS — R7989 Other specified abnormal findings of blood chemistry: Secondary | ICD-10-CM | POA: Diagnosis not present

## 2018-06-12 DIAGNOSIS — E669 Obesity, unspecified: Secondary | ICD-10-CM | POA: Diagnosis not present

## 2018-06-12 DIAGNOSIS — Z0000 Encounter for general adult medical examination without abnormal findings: Secondary | ICD-10-CM | POA: Diagnosis not present

## 2018-06-12 DIAGNOSIS — J4540 Moderate persistent asthma, uncomplicated: Secondary | ICD-10-CM | POA: Diagnosis not present

## 2018-06-12 DIAGNOSIS — Z9884 Bariatric surgery status: Secondary | ICD-10-CM | POA: Diagnosis not present

## 2019-01-05 DIAGNOSIS — N5089 Other specified disorders of the male genital organs: Secondary | ICD-10-CM | POA: Diagnosis not present

## 2019-01-05 DIAGNOSIS — N5082 Scrotal pain: Secondary | ICD-10-CM | POA: Diagnosis not present

## 2023-07-19 ENCOUNTER — Ambulatory Visit (HOSPITAL_COMMUNITY)
Admission: EM | Admit: 2023-07-19 | Discharge: 2023-07-19 | Disposition: A | Discharge status: Home / Self Care | Payer: Self-pay

## 2023-07-19 ENCOUNTER — Ambulatory Visit (INDEPENDENT_AMBULATORY_CARE_PROVIDER_SITE_OTHER): Payer: Self-pay

## 2023-07-19 ENCOUNTER — Other Ambulatory Visit: Payer: Self-pay

## 2023-07-19 ENCOUNTER — Encounter (HOSPITAL_COMMUNITY): Payer: Self-pay | Admitting: *Deleted

## 2023-07-19 DIAGNOSIS — R1032 Left lower quadrant pain: Secondary | ICD-10-CM

## 2023-07-19 MED ORDER — AMOXICILLIN-POT CLAVULANATE 875-125 MG PO TABS: 1.0000 | ORAL_TABLET | Freq: Two times a day (BID) | ORAL | 0 refills | Status: AC

## 2023-07-19 NOTE — Discharge Instructions (Signed)
  1. Abdominal pain, left lower quadrant (Primary) - DG Abd 2 Views x-ray performed in UC shows no acute abdominal obstruction, no significant stool burden, no abnormal bowel gas pattern.  Unable to visualize any distended colonic bowel loops or diverticula.  Recommend CT abdomen pelvis if symptoms worsen or new symptoms arise. - amoxicillin-clavulanate (AUGMENTIN) 875-125 MG tablet; Take 1 tablet by mouth every 12 (twelve) hours.  Dispense: 14 tablet; Refill: 0 - Continue taking MiraLAX to relieve any constipation. - Continue to monitor current symptoms for any change in severity or if there is any development of new symptoms such as fever, bloody diarrhea, severe abdominal pain, severe flank pain.  If so follow-up in the emergency department for further evaluation and management.

## 2023-07-19 NOTE — ED Provider Notes (Signed)
 [facility]  Note:  This document was prepared using Dragon voice recognition software and may include unintentional dictation errors.  MRN: [id number] DOB: 01-03-1986  Subjective:   Dustin Mccall. is a 38 y.o. male presenting for left lower quadrant abdominal pain that began last night.  Patient reports he also has mild constipation and started taking MiraLAX today.  Patient reports past history of diverticulitis with similar presentation to current symptoms.  Patient denies any fever, severe pain, diarrhea, nausea vomiting, bloating.  Patient currently on oxycodone  for pain control, OIC cannot be ruled out as possible cause of left lower quadrant abdominal pain and constipation.  No current facility-administered medications for this encounter.  Current Outpatient Medications:    amoxicillin-clavulanate (AUGMENTIN) 875-125 MG tablet, Take 1 tablet by mouth every 12 (twelve) hours., Disp: 14 tablet, Rfl: 0   mometasone-formoterol (DULERA) 100-5 MCG/ACT AERO, Inhale 2 puffs into the lungs 2 (two) times daily., Disp: , Rfl:    Multiple Vitamin (MULTIVITAMIN WITH MINERALS) TABS tablet, Take 1 tablet by mouth every morning., Disp: , Rfl:    oxyCODONE  (ROXICODONE ) 5 MG/5ML solution, Take 5-10 mLs (5-10 mg total) by mouth every 4 (four) hours as needed for severe pain., Disp: 200 mL, Rfl: 0   No Known Allergies  Past Medical History:  Diagnosis Date   Asthma      Past Surgical History:  Procedure Laterality Date   LAPAROSCOPIC GASTRIC BANDING N/A 02/07/2014   Procedure: LAPAROSCOPIC GASTRIC BANDING;  Surgeon: Fran Imus, MD;  Location: [facility];  Service: General;  Laterality: N/A;   LIPOSUCTION  2010   ABD     History reviewed. No pertinent family history.  Social History   Tobacco Use   Smoking status: Never   Smokeless tobacco: Current    Types: Chew   Tobacco comments:    1 can every three days  Substance Use Topics   Alcohol use: Yes    Comment: 3-4  beers socially   Drug use: No    ROS Refer to HPI for ROS details.  Objective:   Vitals: BP 111/83   Pulse 90   Temp 98.1 F (36.7 C)   Resp 18   SpO2 96%   Physical Exam Vitals and nursing note reviewed.  Constitutional:      General: He is not in acute distress.    Appearance: He is well-developed. He is not ill-appearing or toxic-appearing.  HENT:     Head: Normocephalic.  Cardiovascular:     Rate and Rhythm: Normal rate.  Pulmonary:     Effort: Pulmonary effort is normal. No respiratory distress.  Abdominal:     General: There is no distension.     Palpations: Abdomen is soft.     Tenderness: There is abdominal tenderness (Tenderness to palpation left lower quadrant abdomen, no hardened stool noted on exam). There is no right CVA tenderness, left CVA tenderness, guarding or rebound.  Skin:    General: Skin is warm and dry.  Neurological:     General: No focal deficit present.     Mental Status: He is alert and oriented to person, place, and time.  Psychiatric:        Mood and Affect: Mood normal.        Behavior: Behavior normal.     Procedures  No results found for this or any previous visit (from the past 24 hours).  No results found.   Assessment and Plan :     Discharge  Instructions       1. Abdominal pain, left lower quadrant (Primary) - DG Abd 2 Views x-ray performed in UC shows no acute abdominal obstruction, no significant stool burden, no abnormal bowel gas pattern.  Unable to visualize any distended colonic bowel loops or diverticula.  Recommend CT abdomen pelvis if symptoms worsen or new symptoms arise. - amoxicillin-clavulanate (AUGMENTIN) 875-125 MG tablet; Take 1 tablet by mouth every 12 (twelve) hours.  Dispense: 14 tablet; Refill: 0 - Continue taking MiraLAX to relieve any constipation. - Continue to monitor current symptoms for any change in severity or if there is any development of new symptoms such as fever, bloody diarrhea, severe  abdominal pain, severe flank pain.  If so follow-up in the emergency department for further evaluation and management.    Dravin Lance B Taisa Deloria   Geroge Gilliam, [place], [place] 07/19/23 [phone]

## 2023-07-19 NOTE — ED Triage Notes (Signed)
 ABd pain LLQ with constipation. Pt thinks Pain is from Diverticulitis .

## 2023-07-19 NOTE — ED Triage Notes (Signed)
 PT DOB and Full name confirmed

## 2023-07-20 ENCOUNTER — Ambulatory Visit (HOSPITAL_COMMUNITY): Payer: Self-pay

## 2023-07-22 ENCOUNTER — Ambulatory Visit (HOSPITAL_COMMUNITY): Payer: Self-pay
